# Patient Record
Sex: Female | Born: 1945 | Race: White | Hispanic: No | Marital: Married | Smoking: Never smoker
Health system: Southern US, Community
[De-identification: ages and names within clinical notes are randomized; demographics above are authoritative.]

## PROBLEM LIST (undated history)

## (undated) DIAGNOSIS — N179 Acute kidney failure, unspecified: Secondary | ICD-10-CM

## (undated) DIAGNOSIS — F329 Major depressive disorder, single episode, unspecified: Secondary | ICD-10-CM

## (undated) DIAGNOSIS — I10 Essential (primary) hypertension: Secondary | ICD-10-CM

## (undated) DIAGNOSIS — C801 Malignant (primary) neoplasm, unspecified: Secondary | ICD-10-CM

## (undated) DIAGNOSIS — E7800 Pure hypercholesterolemia, unspecified: Secondary | ICD-10-CM

## (undated) DIAGNOSIS — N12 Tubulo-interstitial nephritis, not specified as acute or chronic: Secondary | ICD-10-CM

## (undated) DIAGNOSIS — G8929 Other chronic pain: Secondary | ICD-10-CM

## (undated) DIAGNOSIS — M1990 Unspecified osteoarthritis, unspecified site: Secondary | ICD-10-CM

## (undated) DIAGNOSIS — F419 Anxiety disorder, unspecified: Secondary | ICD-10-CM

## (undated) DIAGNOSIS — F32A Depression, unspecified: Secondary | ICD-10-CM

## (undated) HISTORY — PX: TUBAL LIGATION: SHX77

## (undated) HISTORY — PX: BREAST SURGERY: SHX581

---

## 2009-09-14 ENCOUNTER — Emergency Department (HOSPITAL_BASED_OUTPATIENT_CLINIC_OR_DEPARTMENT_OTHER): Admission: EM | Admit: 2009-09-14 | Discharge: 2009-09-15 | Payer: Self-pay | Admitting: Emergency Medicine

## 2009-09-14 ENCOUNTER — Ambulatory Visit: Payer: Self-pay | Admitting: Radiology

## 2010-08-21 IMAGING — CT CT HEAD W/O CM
2 series · 16 of 30 positions shown, 18 images · non-contrast
Comparison: None available.

CLINICAL DATA: Confusion.  Nausea.  Fall.

CT HEAD WITHOUT CONTRAST
TECHNIQUE: Contiguous axial images were obtained from the base of
the skull through the vertex without contrast.

[Series 2: head 4.8 h37s · axial · 0.44mm/px · z∈[-112,+5]mm · 8 of 32 slices shown, 10 images]
[im 4/32  brain]
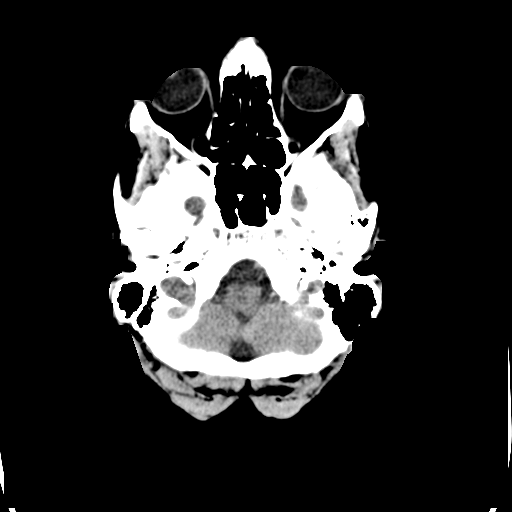
[im 4/32  bone]
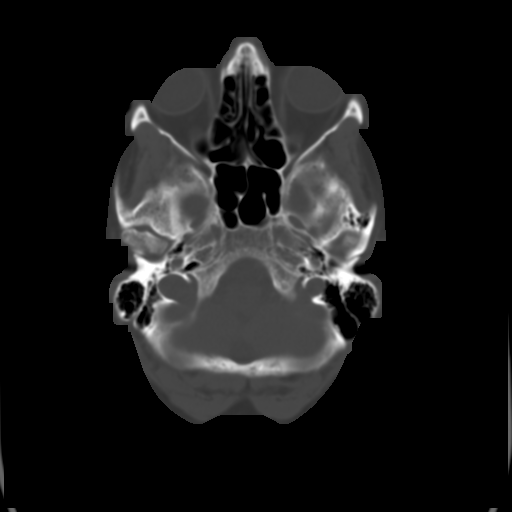
[im 7/32  brain]
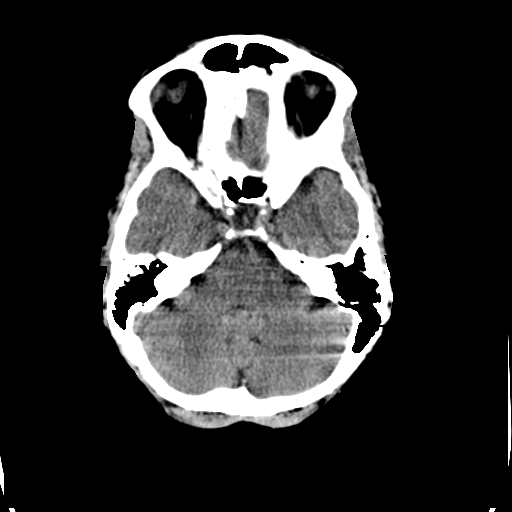
[im 11/32  brain]
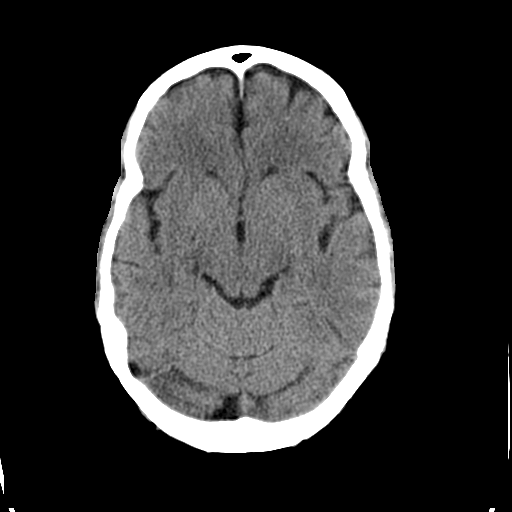
[im 14/32  brain]
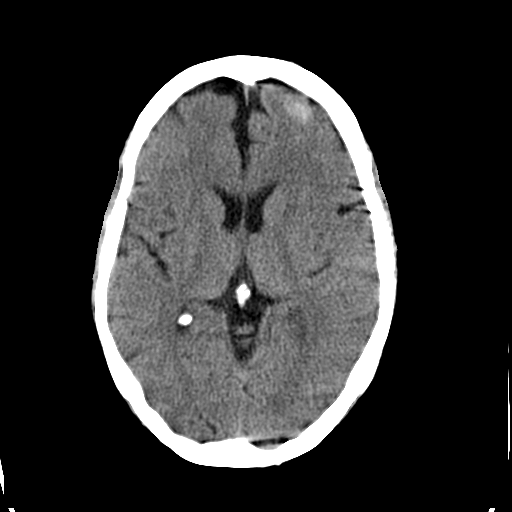
[im 18/32  brain]
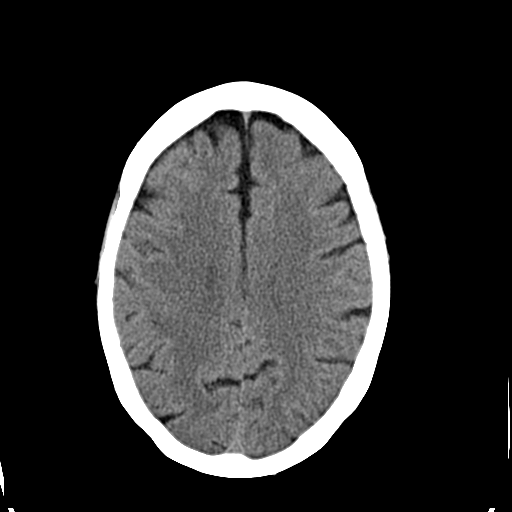
[im 18/32  bone]
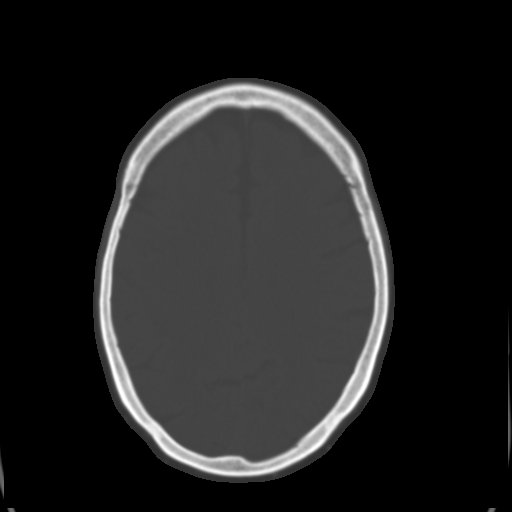
[im 21/32  brain]
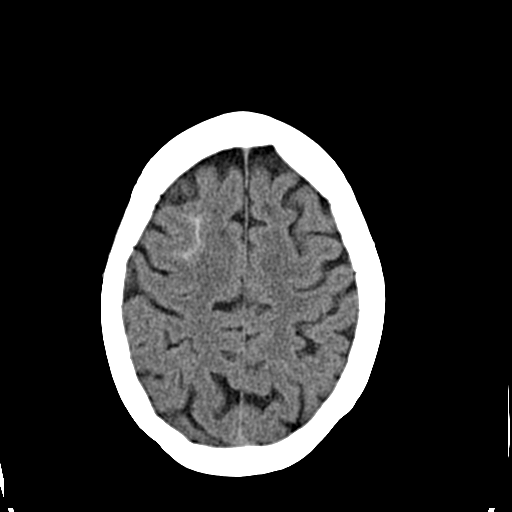
[im 25/32  brain]
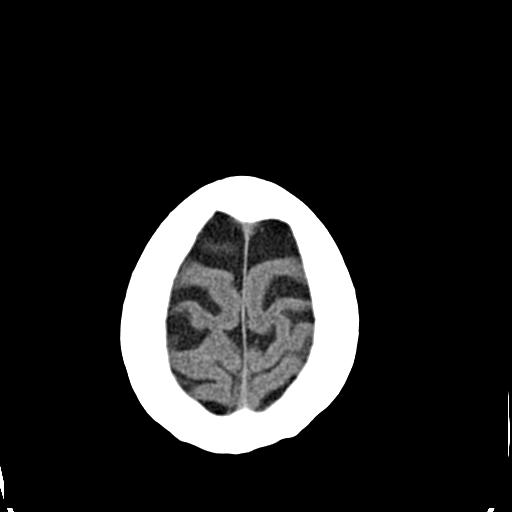
[im 28/32  brain]
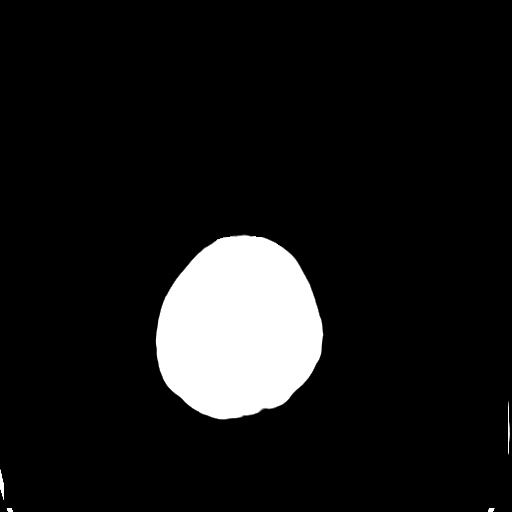

[Series 3: head 2.4 h60s bone · axial · 0.44mm/px · z∈[-113,+8]mm · 8 of 64 slices shown]
[im 7/64  bone]
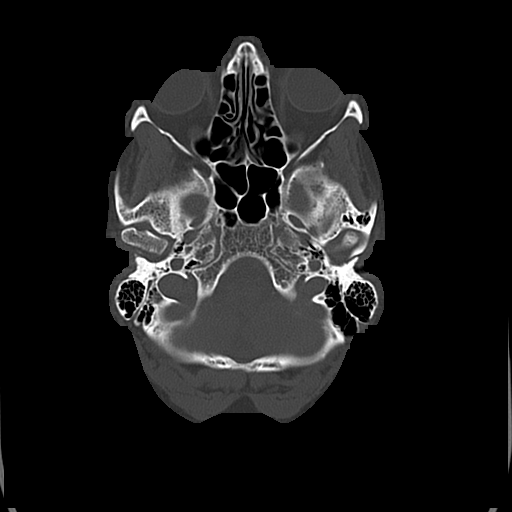
[im 14/64  bone]
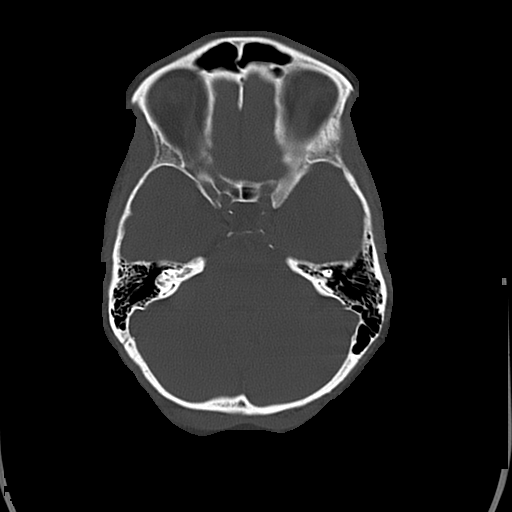
[im 20/64  bone]
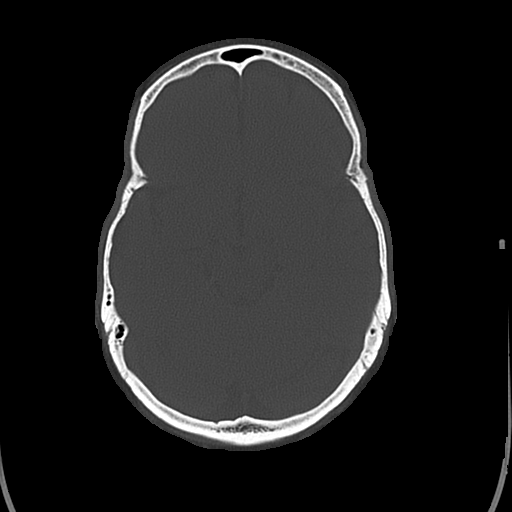
[im 27/64  bone]
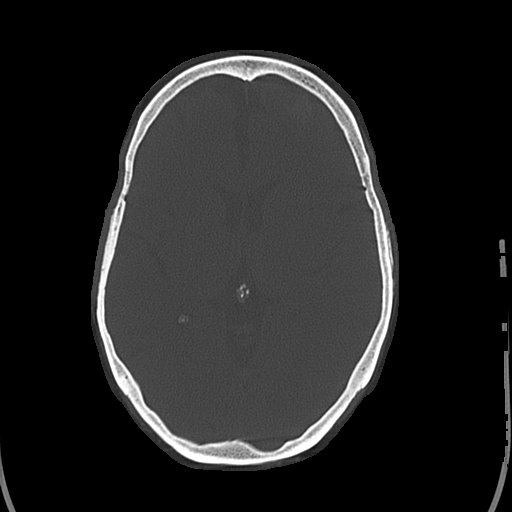
[im 37/64  bone]
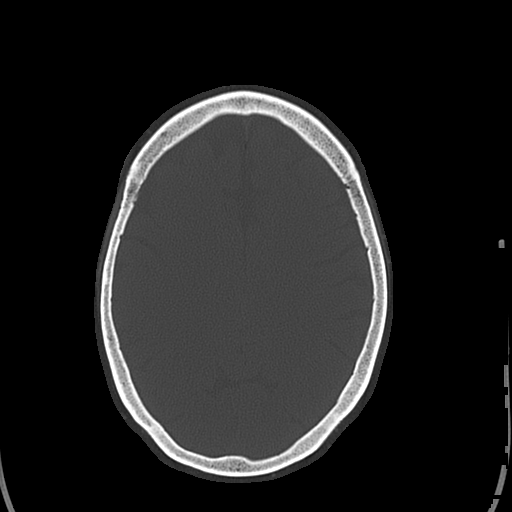
[im 44/64  bone]
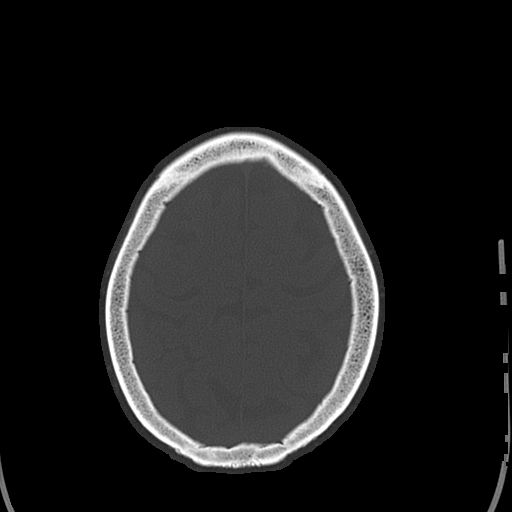
[im 50/64  bone]
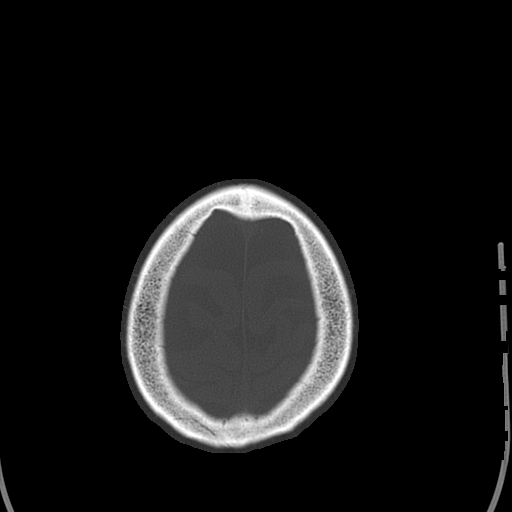
[im 57/64  bone]
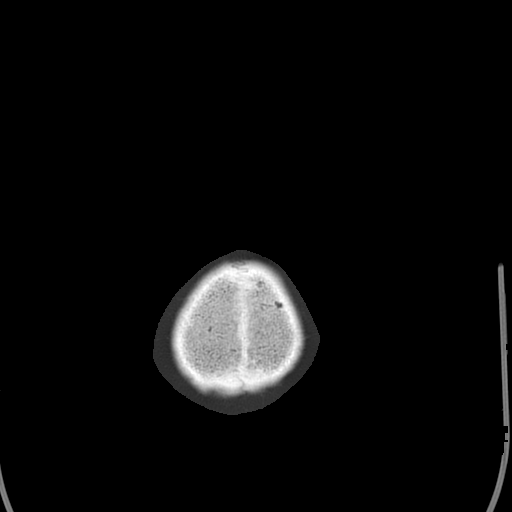

[16 of 30 positions shown; findings below may reference images not displayed]

FINDINGS: There is a hemorrhagic contusion involving the left
frontal lobe.  There is some subarachnoid blood as well.
Subarachnoid blood is noted within the right to frontal lobe as
well on image number 21.  The study is mildly degraded by patient
motion.  The paranasal sinuses and mastoid air cells are clear.
The osseous skull is intact.  The extracranial soft tissues are
unremarkable.
IMPRESSION: 1.  Hemorrhagic contusion left frontal lobe.
2.  Bilateral frontal subarachnoid blood is likely related to
trauma.

Critical test results telephoned to Dr. Kamara at the time of

## 2011-04-02 LAB — BASIC METABOLIC PANEL
CO2: 28 mEq/L (ref 19–32)
Calcium: 9.9 mg/dL (ref 8.4–10.5)
Creatinine, Ser: 1 mg/dL (ref 0.4–1.2)
GFR calc non Af Amer: 56 mL/min — ABNORMAL LOW (ref >60)
Glucose, Bld: 89 mg/dL (ref 70–99)

## 2011-04-02 LAB — PROTIME-INR
INR: 0.9 (ref 0.00–1.49)
Prothrombin Time: 12.1 seconds (ref 11.6–15.2)

## 2011-04-02 LAB — DIFFERENTIAL
Basophils Absolute: 0 10*3/uL (ref 0.0–0.1)
Basophils Relative: 0 % (ref 0–1)
Neutro Abs: 4.8 10*3/uL (ref 1.7–7.7)
Neutrophils Relative %: 67 % (ref 43–77)

## 2011-04-02 LAB — URINALYSIS, ROUTINE W REFLEX MICROSCOPIC
Bilirubin Urine: NEGATIVE
Ketones, ur: NEGATIVE mg/dL
Nitrite: NEGATIVE
Protein, ur: NEGATIVE mg/dL
pH: 6.5 (ref 5.0–8.0)

## 2011-04-02 LAB — URINE MICROSCOPIC-ADD ON

## 2011-04-02 LAB — CBC
MCHC: 35.1 g/dL (ref 30.0–36.0)
Platelets: 201 10*3/uL (ref 150–400)
RDW: 13.2 % (ref 11.5–15.5)

## 2011-04-02 LAB — APTT: aPTT: 27 seconds (ref 24–37)

## 2017-07-27 DIAGNOSIS — N179 Acute kidney failure, unspecified: Secondary | ICD-10-CM

## 2017-07-27 HISTORY — DX: Acute kidney failure, unspecified: N17.9

## 2017-07-30 ENCOUNTER — Emergency Department (HOSPITAL_BASED_OUTPATIENT_CLINIC_OR_DEPARTMENT_OTHER)
Admission: EM | Admit: 2017-07-30 | Discharge: 2017-07-31 | Disposition: A | Discharge status: Home / Self Care | Payer: Medicare HMO | Attending: Emergency Medicine | Admitting: Emergency Medicine

## 2017-07-30 ENCOUNTER — Encounter (HOSPITAL_BASED_OUTPATIENT_CLINIC_OR_DEPARTMENT_OTHER): Payer: Self-pay | Admitting: Emergency Medicine

## 2017-07-30 ENCOUNTER — Emergency Department (HOSPITAL_BASED_OUTPATIENT_CLINIC_OR_DEPARTMENT_OTHER): Payer: Medicare HMO

## 2017-07-30 DIAGNOSIS — R55 Syncope and collapse: Secondary | ICD-10-CM | POA: Diagnosis present

## 2017-07-30 DIAGNOSIS — Z79899 Other long term (current) drug therapy: Secondary | ICD-10-CM | POA: Diagnosis not present

## 2017-07-30 DIAGNOSIS — N3000 Acute cystitis without hematuria: Secondary | ICD-10-CM | POA: Diagnosis not present

## 2017-07-30 DIAGNOSIS — I10 Essential (primary) hypertension: Secondary | ICD-10-CM | POA: Insufficient documentation

## 2017-07-30 DIAGNOSIS — I951 Orthostatic hypotension: Secondary | ICD-10-CM

## 2017-07-30 HISTORY — DX: Depression, unspecified: F32.A

## 2017-07-30 HISTORY — DX: Pure hypercholesterolemia, unspecified: E78.00

## 2017-07-30 HISTORY — DX: Other chronic pain: G89.29

## 2017-07-30 HISTORY — DX: Anxiety disorder, unspecified: F41.9

## 2017-07-30 HISTORY — DX: Essential (primary) hypertension: I10

## 2017-07-30 HISTORY — DX: Unspecified osteoarthritis, unspecified site: M19.90

## 2017-07-30 HISTORY — DX: Major depressive disorder, single episode, unspecified: F32.9

## 2017-07-30 LAB — URINALYSIS, ROUTINE W REFLEX MICROSCOPIC
BILIRUBIN URINE: NEGATIVE
GLUCOSE, UA: NEGATIVE mg/dL
Ketones, ur: NEGATIVE mg/dL
Nitrite: POSITIVE — AB
PH: 6 (ref 5.0–8.0)
Protein, ur: NEGATIVE mg/dL
SPECIFIC GRAVITY, URINE: 1.019 (ref 1.005–1.030)

## 2017-07-30 LAB — CBC WITH DIFFERENTIAL/PLATELET
BASOS ABS: 0 10*3/uL (ref 0.0–0.1)
BASOS PCT: 0 %
Eosinophils Absolute: 0.3 10*3/uL (ref 0.0–0.7)
Eosinophils Relative: 3 %
HEMATOCRIT: 40.6 % (ref 36.0–46.0)
HEMOGLOBIN: 13.4 g/dL (ref 12.0–15.0)
LYMPHS PCT: 24 %
Lymphs Abs: 2.8 10*3/uL (ref 0.7–4.0)
MCH: 31.2 pg (ref 26.0–34.0)
MCHC: 33 g/dL (ref 30.0–36.0)
MCV: 94.6 fL (ref 78.0–100.0)
MONO ABS: 0.8 10*3/uL (ref 0.1–1.0)
Monocytes Relative: 7 %
NEUTROS ABS: 7.9 10*3/uL — AB (ref 1.7–7.7)
NEUTROS PCT: 66 %
Platelets: 244 10*3/uL (ref 150–400)
RBC: 4.29 MIL/uL (ref 3.87–5.11)
RDW: 14.6 % (ref 11.5–15.5)
WBC: 11.8 10*3/uL — AB (ref 4.0–10.5)

## 2017-07-30 LAB — BASIC METABOLIC PANEL
ANION GAP: 12 (ref 5–15)
BUN: 26 mg/dL — ABNORMAL HIGH (ref 6–20)
CALCIUM: 9.6 mg/dL (ref 8.9–10.3)
CO2: 25 mmol/L (ref 22–32)
Chloride: 98 mmol/L — ABNORMAL LOW (ref 101–111)
Creatinine, Ser: 1.24 mg/dL — ABNORMAL HIGH (ref 0.44–1.00)
GFR, EST AFRICAN AMERICAN: 50 mL/min — AB (ref >60)
GFR, EST NON AFRICAN AMERICAN: 43 mL/min — AB (ref >60)
GLUCOSE: 121 mg/dL — AB (ref 65–99)
POTASSIUM: 3.2 mmol/L — AB (ref 3.5–5.1)
Sodium: 135 mmol/L (ref 135–145)

## 2017-07-30 LAB — URINALYSIS, MICROSCOPIC (REFLEX)

## 2017-07-30 LAB — TROPONIN I: Troponin I: <0.03 ng/mL (ref <0.03)

## 2017-07-30 MED ORDER — CEPHALEXIN 250 MG PO CAPS
500.0000 mg | ORAL_CAPSULE | Freq: Once | ORAL | Status: AC
  Administered 2017-07-30: 500 mg via ORAL
  Filled 2017-07-30: qty 2

## 2017-07-30 MED ORDER — CEPHALEXIN 500 MG PO CAPS: 500.0000 mg | ORAL_CAPSULE | Freq: Two times a day (BID) | ORAL | 0 refills | Status: DC

## 2017-07-30 NOTE — Discharge Instructions (Signed)
Please take Keflex every 12 hours for the next 5 days. Please continue to drink plenty of fluids so you do not get hydrated. If he develop new or worsening symptoms including shortness of breath, chest pain, dizziness, or continue to have syncope, please return to the emergency department for reevaluation.  Please call and schedule a follow-up appointment with your primary care provider in the next 5-7 days regarding today's visit to the Emergency Department.

## 2017-07-30 NOTE — ED Triage Notes (Addendum)
Patient brought in by husband because " she is falling a lot and we need to figure out why" - He reports that she "passes" out when she stands up and get very tired all the time. Patient is argumentative that there is nothing wrong with her. Patient states "dont put me in a nursing home" Husband states that he is not going to but needs to figure out what is wrong, he thinks she might be dehydrated or low BP -  Husband reports that she needs to just let her talk. Patient denies any complaints.

## 2017-07-30 NOTE — ED Provider Notes (Signed)
[facility] DEPT Provider Note   CSN: [id number] Arrival date & time: 07/30/17  0938     History   Chief Complaint Chief Complaint  Patient presents with  . Near Syncope    HPI Sarah Huffman is a 71 y.o. female with a h/o of breast CA who presents to the emergency department with chief complaint of syncope. Her husband reports she has been falling more frequently over the last 3-4 days. She states that when she changes from a sitting to a standing position that she will get lightheaded, and her husband states that after she stands after she takes a few steps she will have a syncopal episode for approximately 3-4 seconds and fell to the ground. She states she has complained of left shoulder, knee, and hip pain from recent falls. He denies a history of similar. She denies HA, dizziness, tinnitus, chest pain, palpitations, dyspnea, or weakness. She reports no recent medication changes. She denies fever, chills, dysuria, hematochezia, or melena. She reports some urinary hesitancy over the last few weeks. No frequency, abdominal pain, or back pain. She reports that she tries to maintain good oral intake at home.   The history is provided by the patient. No language interpreter was used.    Past Medical History:  Diagnosis Date  . Anxiety   . Arthritis   . Chronic pain   . Depressed   . Hypercholesterolemia   . Hypertension     There are no active problems to display for this patient.   Past Surgical History:  Procedure Laterality Date  . BREAST SURGERY    . TUBAL LIGATION      OB History    No data available       Home Medications    Prior to Admission medications   Medication Sig Start Date End Date Taking? Authorizing Provider  alendronate (FOSAMAX) 70 MG tablet Take 70 mg by mouth once a week. Take with a full glass of water on an empty stomach.   Yes [provider]  diazepam (VALIUM) 5 MG tablet Take 5 mg by mouth every 6 (six) hours as needed for  anxiety.   Yes [provider]  DULoxetine (CYMBALTA) 20 MG capsule Take 20 mg by mouth daily.   Yes [provider]  gabapentin (NEURONTIN) 800 MG tablet Take 800 mg by mouth 3 (three) times daily.   Yes [provider]  lisinopril (PRINIVIL,ZESTRIL) 10 MG tablet Take 10 mg by mouth daily.   Yes [provider]  simvastatin (ZOCOR) 20 MG tablet Take 20 mg by mouth daily.   Yes [provider]  traMADol (ULTRAM) 50 MG tablet Take by mouth 2 (two) times daily.   Yes [provider]  cephALEXin (KEFLEX) 500 MG capsule Take 1 capsule (500 mg total) by mouth 2 (two) times daily. 07/30/17   Shamiracle Gorden, Laymond Purser, PA-C    Family History History reviewed. No pertinent family history.  Social History Social History  Substance Use Topics  . Smoking status: Never Smoker  . Smokeless tobacco: Never Used  . Alcohol use No     Allergies   Codeine and Ivp dye [iodinated diagnostic agents]   Review of Systems Review of Systems  Constitutional: Negative for activity change, chills and fever.  Respiratory: Negative for shortness of breath.   Cardiovascular: Negative for chest pain and palpitations.  Gastrointestinal: Negative for abdominal pain, anal bleeding, blood in stool, diarrhea, nausea and vomiting.  Genitourinary: Negative for dysuria and frequency.       +  Urinary hesitancy   Musculoskeletal: Negative for back pain.  Skin: Negative for rash.  Neurological: Positive for light-headedness. Negative for dizziness, weakness and headaches.   Physical Exam Updated Vital Signs BP 132/67   Pulse 71   Temp 98.8 F (37.1 C) (Oral)   Resp 20   Ht 5\' 4"  (1.626 m)   Wt 63.5 kg (140 lb)   SpO2 98%   BMI 24.03 kg/m   Physical Exam  Constitutional: She is oriented to person, place, and time. She appears well-developed and well-nourished. No distress.  HENT:  Head: Normocephalic.  Eyes: Pupils are equal, round, and reactive to light.  Conjunctivae and EOM are normal. No scleral icterus.  Neck: Normal range of motion. Neck supple.  Cardiovascular: Normal rate and regular rhythm.  Exam reveals no gallop and no friction rub.   No murmur heard. Pulmonary/Chest: Effort normal and breath sounds normal. No respiratory distress. She has no wheezes. She has no rales. She exhibits no tenderness.  Abdominal: Soft. Bowel sounds are normal. She exhibits no distension. There is no tenderness. There is no rebound and no guarding.  Musculoskeletal: She exhibits no deformity.  TTP over the left lateral knee and hip. Full active and passive ROM of both joints. No left ankle pain. No right lower extremity complaints.   Neurological: She is alert and oriented to person, place, and time.  Cranial nerves 2-12 intact. Finger-to-nose is normal. 5/5 motor strength of the bilateral upper and lower extremities. Moves all four extremities. Negative Romberg. Ambulatory without difficulty. NVI.    Skin: Skin is warm. Capillary refill takes less than 2 seconds. No rash noted. She is not diaphoretic.  Large area of ecchymosis noted to the left upper arm   Psychiatric: Her speech is normal and behavior is normal. Thought content normal. Her affect is inappropriate. She is not actively hallucinating. Cognition and memory are normal. She expresses impulsivity and inappropriate judgment. She is attentive.  Nursing note and vitals reviewed.    ED Treatments / Results  Labs (all labs ordered are listed, but only abnormal results are displayed) Labs Reviewed  CBC WITH DIFFERENTIAL/PLATELET - Abnormal; Notable for the following:       Result Value   WBC 11.8 (*)    Neutro Abs 7.9 (*)    All other components within normal limits  BASIC METABOLIC PANEL - Abnormal; Notable for the following:    Potassium 3.2 (*)    Chloride 98 (*)    Glucose, Bld 121 (*)    BUN 26 (*)    Creatinine, Ser 1.24 (*)    GFR calc non Af Amer 43 (*)    GFR calc Af Amer 50 (*)      All other components within normal limits  URINALYSIS, ROUTINE W REFLEX MICROSCOPIC - Abnormal; Notable for the following:    APPearance CLOUDY (*)    Hgb urine dipstick TRACE (*)    Nitrite POSITIVE (*)    Leukocytes, UA SMALL (*)    All other components within normal limits  URINALYSIS, MICROSCOPIC (REFLEX) - Abnormal; Notable for the following:    Bacteria, UA MANY (*)    Squamous Epithelial / LPF 0-5 (*)    All other components within normal limits  URINE CULTURE  TROPONIN I    EKG  EKG Interpretation  Date/Time:  Saturday July 30 2017 19:54:13 EDT Ventricular Rate:  77 PR Interval:    QRS Duration: 96 QT Interval:  347 QTC Calculation: 393 R Axis:   -  75 Text Interpretation:  Sinus rhythm Consider left atrial enlargement Inferior infarct, old Consider anterolateral infarct Q waves new when compared to prior Confirmed by Thayer Jew [phone]) on 07/30/2017 8:57:36 PM       Radiology Ct Head Wo Contrast  Result Date: 07/30/2017 CLINICAL DATA:  Multiple falls, syncopal episodes. Tired. History of hypertension and hypercholesterolemia. EXAM: CT HEAD WITHOUT CONTRAST TECHNIQUE: Contiguous axial images were obtained from the base of the skull through the vertex without intravenous contrast. COMPARISON:  CT HEAD September 14, 2009 FINDINGS: BRAIN: No intraparenchymal hemorrhage, mass effect nor midline shift. The ventricles and sulci are normal for age. Patchy supratentorial white matter hypodensities less than expected for patient's age, though non-specific are most compatible with chronic small vessel ischemic disease. No acute large vascular territory infarcts. Symmetrically prominent bifrontal parafalcine extra-axial spaces. Basal cisterns are patent. VASCULAR: Mild calcific atherosclerosis of the carotid siphons. SKULL: No skull fracture. No significant scalp soft tissue swelling. SINUSES/ORBITS: The mastoid air-cells and included paranasal sinuses are well-aerated.The  included ocular globes and orbital contents are non-suspicious. OTHER: None. IMPRESSION: 1. No acute intracranial process. 2. 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 3. Otherwise negative noncontrast CT HEAD for age. Electronically Signed   By: Elon Alas M.D.   On: 07/30/2017 23:28    Procedures Procedures (including critical care time)  Medications Ordered in ED Medications  cephALEXin (KEFLEX) capsule 500 mg (500 mg Oral Given 07/30/17 2349)     Initial Impression / Assessment and Plan / ED Course  I have reviewed the triage vital signs and the nursing notes.  Pertinent labs & imaging results that were available during my care of the patient were reviewed by me and considered in my medical decision making (see chart for details).     71 year old female presenting with increased falls and syncopal episodes 3-4 days. Orthostatic vitals 106/77 to 80/61 with standing. Cr 1.24 vs 1.0 in 2010. IVF bolus given and orthostatic vitals repeated; 129/93 sitting and 126/83 standing. Patient initially refused all imaging, but eventually complied with head CT. CT demonstrating small bifrontal hygroma vs old subdural hematomas; no acute pathology. Mild hypokalemia 3.2. EKG with new Q waves, but no evidence of prolonged Qtc. UA is nitrite positive with small leukocytes. Patient also endorses urinary hesitancy. Will treat for UTI with keflex. No anemia noted on CBC. Trop negative.   After fluid bolus, patient feels much improved. Discussed and evaluated the patient with Dr. Dina Rich, attending physician. Will d/c the patient to home with follow up her PCP within the next week. Strict return precautions given. NAD. The patient is safe for d/c at this time.   Final Clinical Impressions(s) / ED Diagnoses   Final diagnoses:  Orthostatic syncope  Acute cystitis without hematuria    New Prescriptions Discharge Medication List as of 07/30/2017 11:39 PM    START taking these medications    Details  cephALEXin (KEFLEX) 500 MG capsule Take 1 capsule (500 mg total) by mouth 2 (two) times daily., Starting Sat 07/30/2017, Print         Isayah Ignasiak A, PA-C 08/01/17 1962    Merryl Hacker, MD 08/01/17 [phone]

## 2017-08-04 LAB — URINE CULTURE: Culture: >=100000 — AB

## 2017-08-05 ENCOUNTER — Telehealth: Payer: Self-pay | Admitting: *Deleted

## 2017-08-05 NOTE — Telephone Encounter (Signed)
Post ED Visit - Positive Culture Follow-up  Culture report reviewed by antimicrobial stewardship pharmacist:  []  Elenor Quinones, Pharm.D. []  Heide Guile, Pharm.D., BCPS AQ-ID []  Parks Neptune, Pharm.D., BCPS []  Alycia Rossetti, Pharm.D., BCPS []  [place], Pharm.D., BCPS, AAHIVP []  Legrand Como, Pharm.D., BCPS, AAHIVP []  Salome Arnt, PharmD, BCPS []  Dimitri Ped, PharmD, BCPS []  Vincenza Hews, PharmD, BCPS Nida Boatman, PharmD  Positive urine culture Treated with Cephalexin, organism sensitive to the same and no further patient follow-up is required at this time.  [name] 08/05/2017, 11:10 AM

## 2017-08-17 ENCOUNTER — Observation Stay (HOSPITAL_BASED_OUTPATIENT_CLINIC_OR_DEPARTMENT_OTHER)
Admission: EM | Admit: 2017-08-17 | Discharge: 2017-08-19 | Disposition: A | Discharge status: Home / Self Care | Payer: Medicare HMO | Attending: Internal Medicine | Admitting: Internal Medicine

## 2017-08-17 ENCOUNTER — Emergency Department (HOSPITAL_BASED_OUTPATIENT_CLINIC_OR_DEPARTMENT_OTHER): Payer: Medicare HMO

## 2017-08-17 ENCOUNTER — Encounter (HOSPITAL_BASED_OUTPATIENT_CLINIC_OR_DEPARTMENT_OTHER): Payer: Self-pay

## 2017-08-17 DIAGNOSIS — G629 Polyneuropathy, unspecified: Secondary | ICD-10-CM | POA: Diagnosis not present

## 2017-08-17 DIAGNOSIS — N12 Tubulo-interstitial nephritis, not specified as acute or chronic: Secondary | ICD-10-CM | POA: Diagnosis not present

## 2017-08-17 DIAGNOSIS — F418 Other specified anxiety disorders: Secondary | ICD-10-CM | POA: Diagnosis not present

## 2017-08-17 DIAGNOSIS — R319 Hematuria, unspecified: Secondary | ICD-10-CM | POA: Diagnosis not present

## 2017-08-17 DIAGNOSIS — N183 Chronic kidney disease, stage 3 (moderate): Secondary | ICD-10-CM | POA: Diagnosis not present

## 2017-08-17 DIAGNOSIS — F419 Anxiety disorder, unspecified: Secondary | ICD-10-CM | POA: Insufficient documentation

## 2017-08-17 DIAGNOSIS — Z91041 Radiographic dye allergy status: Secondary | ICD-10-CM | POA: Insufficient documentation

## 2017-08-17 DIAGNOSIS — E876 Hypokalemia: Secondary | ICD-10-CM | POA: Diagnosis present

## 2017-08-17 DIAGNOSIS — R296 Repeated falls: Secondary | ICD-10-CM | POA: Insufficient documentation

## 2017-08-17 DIAGNOSIS — I10 Essential (primary) hypertension: Secondary | ICD-10-CM | POA: Diagnosis not present

## 2017-08-17 DIAGNOSIS — Z888 Allergy status to other drugs, medicaments and biological substances status: Secondary | ICD-10-CM | POA: Insufficient documentation

## 2017-08-17 DIAGNOSIS — N179 Acute kidney failure, unspecified: Secondary | ICD-10-CM | POA: Diagnosis not present

## 2017-08-17 DIAGNOSIS — M1990 Unspecified osteoarthritis, unspecified site: Secondary | ICD-10-CM | POA: Diagnosis not present

## 2017-08-17 DIAGNOSIS — D649 Anemia, unspecified: Secondary | ICD-10-CM | POA: Diagnosis not present

## 2017-08-17 DIAGNOSIS — Z885 Allergy status to narcotic agent status: Secondary | ICD-10-CM | POA: Diagnosis not present

## 2017-08-17 DIAGNOSIS — G8929 Other chronic pain: Secondary | ICD-10-CM | POA: Diagnosis present

## 2017-08-17 DIAGNOSIS — E871 Hypo-osmolality and hyponatremia: Secondary | ICD-10-CM | POA: Diagnosis not present

## 2017-08-17 DIAGNOSIS — R29898 Other symptoms and signs involving the musculoskeletal system: Secondary | ICD-10-CM | POA: Diagnosis present

## 2017-08-17 DIAGNOSIS — G9340 Encephalopathy, unspecified: Secondary | ICD-10-CM | POA: Diagnosis present

## 2017-08-17 DIAGNOSIS — Z79899 Other long term (current) drug therapy: Secondary | ICD-10-CM | POA: Insufficient documentation

## 2017-08-17 DIAGNOSIS — E7800 Pure hypercholesterolemia, unspecified: Secondary | ICD-10-CM | POA: Insufficient documentation

## 2017-08-17 DIAGNOSIS — I129 Hypertensive chronic kidney disease with stage 1 through stage 4 chronic kidney disease, or unspecified chronic kidney disease: Secondary | ICD-10-CM | POA: Insufficient documentation

## 2017-08-17 DIAGNOSIS — R6520 Severe sepsis without septic shock: Secondary | ICD-10-CM | POA: Insufficient documentation

## 2017-08-17 DIAGNOSIS — N390 Urinary tract infection, site not specified: Principal | ICD-10-CM

## 2017-08-17 DIAGNOSIS — F329 Major depressive disorder, single episode, unspecified: Secondary | ICD-10-CM | POA: Diagnosis not present

## 2017-08-17 DIAGNOSIS — R410 Disorientation, unspecified: Secondary | ICD-10-CM | POA: Diagnosis present

## 2017-08-17 DIAGNOSIS — I951 Orthostatic hypotension: Secondary | ICD-10-CM | POA: Diagnosis not present

## 2017-08-17 DIAGNOSIS — R509 Fever, unspecified: Secondary | ICD-10-CM

## 2017-08-17 DIAGNOSIS — R2681 Unsteadiness on feet: Secondary | ICD-10-CM | POA: Diagnosis not present

## 2017-08-17 HISTORY — DX: Tubulo-interstitial nephritis, not specified as acute or chronic: N12

## 2017-08-17 HISTORY — DX: Acute kidney failure, unspecified: N17.9

## 2017-08-17 HISTORY — DX: Malignant (primary) neoplasm, unspecified: C80.1

## 2017-08-17 LAB — CBC WITH DIFFERENTIAL/PLATELET
Basophils Absolute: 0 10*3/uL (ref 0.0–0.1)
Basophils Relative: 0 %
EOS PCT: 0 %
Eosinophils Absolute: 0 10*3/uL (ref 0.0–0.7)
HCT: 35.3 % — ABNORMAL LOW (ref 36.0–46.0)
Hemoglobin: 11.8 g/dL — ABNORMAL LOW (ref 12.0–15.0)
LYMPHS ABS: 0.8 10*3/uL (ref 0.7–4.0)
LYMPHS PCT: 6 %
MCH: 30.9 pg (ref 26.0–34.0)
MCHC: 33.4 g/dL (ref 30.0–36.0)
MCV: 92.4 fL (ref 78.0–100.0)
MONO ABS: 0.8 10*3/uL (ref 0.1–1.0)
MONOS PCT: 6 %
Neutro Abs: 12.2 10*3/uL — ABNORMAL HIGH (ref 1.7–7.7)
Neutrophils Relative %: 88 %
PLATELETS: 189 10*3/uL (ref 150–400)
RBC: 3.82 MIL/uL — ABNORMAL LOW (ref 3.87–5.11)
RDW: 14 % (ref 11.5–15.5)
WBC: 13.8 10*3/uL — ABNORMAL HIGH (ref 4.0–10.5)

## 2017-08-17 LAB — COMPREHENSIVE METABOLIC PANEL
ALBUMIN: 3.7 g/dL (ref 3.5–5.0)
ALT: 19 U/L (ref 14–54)
AST: 22 U/L (ref 15–41)
Alkaline Phosphatase: 87 U/L (ref 38–126)
Anion gap: 14 (ref 5–15)
BILIRUBIN TOTAL: 0.5 mg/dL (ref 0.3–1.2)
BUN: 26 mg/dL — AB (ref 6–20)
CHLORIDE: 86 mmol/L — AB (ref 101–111)
CO2: 26 mmol/L (ref 22–32)
Calcium: 9.2 mg/dL (ref 8.9–10.3)
Creatinine, Ser: 1.71 mg/dL — ABNORMAL HIGH (ref 0.44–1.00)
GFR calc Af Amer: 34 mL/min — ABNORMAL LOW (ref >60)
GFR calc non Af Amer: 29 mL/min — ABNORMAL LOW (ref >60)
GLUCOSE: 102 mg/dL — AB (ref 65–99)
POTASSIUM: 3.3 mmol/L — AB (ref 3.5–5.1)
Sodium: 126 mmol/L — ABNORMAL LOW (ref 135–145)
Total Protein: 7.9 g/dL (ref 6.5–8.1)

## 2017-08-17 LAB — URINALYSIS, MICROSCOPIC (REFLEX)

## 2017-08-17 LAB — RAPID URINE DRUG SCREEN, HOSP PERFORMED
Amphetamines: NOT DETECTED
BARBITURATES: NOT DETECTED
BENZODIAZEPINES: POSITIVE — AB
Cocaine: NOT DETECTED
Opiates: NOT DETECTED
Tetrahydrocannabinol: NOT DETECTED

## 2017-08-17 LAB — I-STAT VENOUS BLOOD GAS, ED
ACID-BASE EXCESS: 4 mmol/L — AB (ref 0.0–2.0)
Bicarbonate: 29.5 mmol/L — ABNORMAL HIGH (ref 20.0–28.0)
O2 SAT: 21 %
PH VEN: 7.429 (ref 7.250–7.430)
PO2 VEN: 15 mmHg — AB (ref 32.0–45.0)
TCO2: 31 mmol/L (ref 0–100)
pCO2, Ven: 44.5 mmHg (ref 44.0–60.0)

## 2017-08-17 LAB — URINALYSIS, ROUTINE W REFLEX MICROSCOPIC
Bilirubin Urine: NEGATIVE
GLUCOSE, UA: NEGATIVE mg/dL
Ketones, ur: NEGATIVE mg/dL
Nitrite: POSITIVE — AB
PROTEIN: 100 mg/dL — AB
Specific Gravity, Urine: 1.017 (ref 1.005–1.030)
pH: 6 (ref 5.0–8.0)

## 2017-08-17 LAB — TROPONIN I: Troponin I: <0.03 ng/mL (ref <0.03)

## 2017-08-17 LAB — CBG MONITORING, ED: GLUCOSE-CAPILLARY: 98 mg/dL (ref 65–99)

## 2017-08-17 LAB — I-STAT CG4 LACTIC ACID, ED: LACTIC ACID, VENOUS: 1.08 mmol/L (ref 0.5–1.9)

## 2017-08-17 LAB — AMMONIA: Ammonia: <9 umol/L — ABNORMAL LOW (ref 9–35)

## 2017-08-17 LAB — ETHANOL

## 2017-08-17 MED ORDER — POTASSIUM CHLORIDE IN NACL 20-0.9 MEQ/L-% IV SOLN
INTRAVENOUS | Status: AC
  Administered 2017-08-17 – 2017-08-18 (×2): via INTRAVENOUS
  Filled 2017-08-17 (×2): qty 1000

## 2017-08-17 MED ORDER — TRAMADOL HCL 50 MG PO TABS: 50.0000 mg | ORAL_TABLET | Freq: Two times a day (BID) | ORAL | Status: DC

## 2017-08-17 MED ORDER — POTASSIUM CHLORIDE CRYS ER 20 MEQ PO TBCR
20.0000 meq | EXTENDED_RELEASE_TABLET | Freq: Once | ORAL | Status: AC
  Administered 2017-08-17: 20 meq via ORAL
  Filled 2017-08-17: qty 1

## 2017-08-17 MED ORDER — ONDANSETRON HCL 4 MG PO TABS: 4.0000 mg | ORAL_TABLET | Freq: Four times a day (QID) | ORAL | Status: DC | PRN

## 2017-08-17 MED ORDER — BISACODYL 5 MG PO TBEC: 5.0000 mg | DELAYED_RELEASE_TABLET | Freq: Every day | ORAL | Status: DC | PRN

## 2017-08-17 MED ORDER — ACETAMINOPHEN 500 MG PO TABS
1000.0000 mg | ORAL_TABLET | Freq: Once | ORAL | Status: AC
  Administered 2017-08-17: 1000 mg via ORAL
  Filled 2017-08-17: qty 2

## 2017-08-17 MED ORDER — DIAZEPAM 5 MG PO TABS: 5.0000 mg | ORAL_TABLET | Freq: Two times a day (BID) | ORAL | Status: DC | PRN

## 2017-08-17 MED ORDER — DULOXETINE HCL 30 MG PO CPEP: 30.0000 mg | ORAL_CAPSULE | Freq: Every day | ORAL | Status: DC

## 2017-08-17 MED ORDER — SIMVASTATIN 20 MG PO TABS
20.0000 mg | ORAL_TABLET | Freq: Every day | ORAL | Status: DC
  Administered 2017-08-18: 20 mg via ORAL
  Filled 2017-08-17: qty 1

## 2017-08-17 MED ORDER — SODIUM CHLORIDE 0.9 % IV BOLUS (SEPSIS)
1000.0000 mL | Freq: Once | INTRAVENOUS | Status: AC
  Administered 2017-08-17: 1000 mL via INTRAVENOUS

## 2017-08-17 MED ORDER — DEXTROSE 5 % IV SOLN
1.0000 g | Freq: Once | INTRAVENOUS | Status: AC
  Administered 2017-08-17: 1 g via INTRAVENOUS
  Filled 2017-08-17: qty 10

## 2017-08-17 MED ORDER — HYDROCODONE-ACETAMINOPHEN 5-325 MG PO TABS
1.0000 | ORAL_TABLET | ORAL | Status: DC | PRN
  Administered 2017-08-17 – 2017-08-18 (×3): 1 via ORAL
  Administered 2017-08-18 – 2017-08-19 (×2): 2 via ORAL
  Filled 2017-08-17: qty 2
  Filled 2017-08-17: qty 1
  Filled 2017-08-17: qty 2
  Filled 2017-08-17 (×2): qty 1

## 2017-08-17 MED ORDER — ONDANSETRON HCL 4 MG/2ML IJ SOLN: 4.0000 mg | Freq: Four times a day (QID) | INTRAMUSCULAR | Status: DC | PRN

## 2017-08-17 MED ORDER — ACETAMINOPHEN 650 MG RE SUPP: 650.0000 mg | Freq: Four times a day (QID) | RECTAL | Status: DC | PRN

## 2017-08-17 MED ORDER — HEPARIN SODIUM (PORCINE) 5000 UNIT/ML IJ SOLN
5000.0000 [IU] | Freq: Three times a day (TID) | INTRAMUSCULAR | Status: DC
  Administered 2017-08-17 – 2017-08-18 (×3): 5000 [IU] via SUBCUTANEOUS
  Filled 2017-08-17 (×4): qty 1

## 2017-08-17 MED ORDER — SENNOSIDES-DOCUSATE SODIUM 8.6-50 MG PO TABS: 1.0000 | ORAL_TABLET | Freq: Every evening | ORAL | Status: DC | PRN

## 2017-08-17 MED ORDER — CEFTRIAXONE SODIUM 1 G IJ SOLR
1.0000 g | INTRAMUSCULAR | Status: DC
  Administered 2017-08-18 – 2017-08-19 (×2): 1 g via INTRAVENOUS
  Filled 2017-08-17 (×2): qty 10

## 2017-08-17 MED ORDER — BUPRENORPHINE 10 MCG/HR TD PTWK: 10.0000 ug | MEDICATED_PATCH | TRANSDERMAL | Status: DC

## 2017-08-17 MED ORDER — GABAPENTIN 400 MG PO CAPS
800.0000 mg | ORAL_CAPSULE | Freq: Two times a day (BID) | ORAL | Status: DC
  Administered 2017-08-17 – 2017-08-19 (×4): 800 mg via ORAL
  Filled 2017-08-17 (×4): qty 2

## 2017-08-17 MED ORDER — ACETAMINOPHEN 325 MG PO TABS: 650.0000 mg | ORAL_TABLET | Freq: Four times a day (QID) | ORAL | Status: DC | PRN

## 2017-08-17 MED ORDER — SODIUM CHLORIDE 0.9% FLUSH
3.0000 mL | Freq: Two times a day (BID) | INTRAVENOUS | Status: DC
  Administered 2017-08-17 – 2017-08-18 (×2): 3 mL via INTRAVENOUS

## 2017-08-17 NOTE — ED Triage Notes (Signed)
Pt c/o HA, unsteady gait, increase in confusion, weakenss 4 days-pt fell last night-was seen for similar c/o 2 weeks-was feeling better until the last 4 days-pt presents to triage in w/c with husband-NAD

## 2017-08-17 NOTE — Progress Notes (Signed)
Pr arrived to unit alert and oriented x4, pt oriented to floor and room call bell and phone in reach, md notified and awaiting orders

## 2017-08-17 NOTE — H&P (Signed)
History and Physical    Sarah Huffman OEV:[id number] DOB: [birth year] DOA: 08/17/2017  PCP: Patient, No Pcp Per   Patient coming from: Home, by way of [facility] ED   Chief Complaint: Confusion, gen weakness   HPI: Sarah Huffman is a 71 y.o. female with medical history significant for hypertension, chronic pain, and depression with anxiety, now presenting to the emergency department for evaluation of progressive confusion and generalized weakness. Patient had a similar presentation to the emergency department couple weeks ago, her condition was attributed to dehydration with orthostasis, and she improved with IV fluid hydration. Now, over the past 4 days, symptoms have returned, and have progressively worsened. Patient has not been expressing any specific complaints. Husband is noted her to be confused and generally weak, with difficulty ambulating. She has not been vomiting and does not seem to be coughing or in any respiratory distress. She is not known to use alcohol or illicit substances.  [facility] ED Course: Upon arrival to the ED, patient is found to be febrile to 39.3 C, saturating well on room air, and with vitals otherwise stable. EKG features a sinus rhythm with anterolateral Q waves. Chest x-ray is negative for acute cardiopulmonary disease and non-contrast head CT is negative for acute intracranial abnormality. Chemistry panel was notable for a sodium of 126, having been normal less than 3 weeks ago, potassium of 3.3, and creatinine 1.71, up from 1.24 earlier this month. CBC is notable for a leukocytosis to 13,800 and a mild normocytic anemia with hemoglobin of 11.8. Ammonia level is normal and ethanol is undetectable. UDS is positive for her prescribed benzodiazepine only. Urinalysis is sugestive of infection. Lactic acid is reassuring at 1.08. She was given a liter of normal saline, Tylenol, and empiric Rocephin after blood cultures have been collected. Emergency department  physician appreciated some focal right arm weakness on exam and neurology was consulted. Patient remained hemodynamically stable and in no apparent respiratory distress and will be admitted to the telemetry unit at [facility] for ongoing evaluation and management of acute encephalopathy suspected secondary to UTI, with some concern for possible acute neurologic event.  Review of Systems:  All other systems reviewed and apart from HPI, are negative.  Past Medical History:  Diagnosis Date  . Anxiety   . Arthritis   . Chronic pain   . Depressed   . Hypercholesterolemia   . Hypertension     Past Surgical History:  Procedure Laterality Date  . BREAST SURGERY    . TUBAL LIGATION       reports that she has never smoked. She has never used smokeless tobacco. She reports that she does not drink alcohol or use drugs.  Allergies  Allergen Reactions  . Amitriptyline Other (See Comments)    Syncope  . Codeine Other (See Comments)    Family History of an allergy  . Ivp Dye [Iodinated Diagnostic Agents] Other (See Comments) and Hypertension       . Meloxicam Swelling    History reviewed. No pertinent family history.   Prior to Admission medications   Medication Sig Start Date End Date Taking? Authorizing Provider  alendronate (FOSAMAX) 70 MG tablet Take 70 mg by mouth once a week. Take with a full glass of water on an empty stomach.   Yes [provider]  buprenorphine (BUTRANS - DOSED MCG/HR) 10 MCG/HR PTWK patch Place 1 patch onto the skin every 7 (seven) days. 07/24/17  Yes [provider]  diazepam (VALIUM)  5 MG tablet Take 5 mg by mouth 2 (two) times daily as needed for anxiety.    Yes [provider]  DULoxetine (CYMBALTA) 30 MG capsule Take 30 mg by mouth daily. 06/08/17  Yes [provider]  gabapentin (NEURONTIN) 400 MG capsule Take 800 mg by mouth 4 (four) times daily. 07/28/17  Yes [provider]  lisinopril  (PRINIVIL,ZESTRIL) 10 MG tablet Take 10 mg by mouth daily.   Yes [provider]  simvastatin (ZOCOR) 20 MG tablet Take 20 mg by mouth daily.   Yes [provider]  triamterene-hydrochlorothiazide (DYAZIDE) 37.5-25 MG capsule Take 1 capsule by mouth daily. 07/15/17  Yes [provider]  traMADol (ULTRAM) 50 MG tablet Take by mouth 2 (two) times daily.    [provider]    Physical Exam: Vitals:   08/17/17 1600 08/17/17 1603 08/17/17 1617 08/17/17 1904  BP: 130/81  113/79 106/82  Pulse:  88 85 76  Resp: (!) 22 (!) 23 20   Temp:   99.1 F (37.3 C) 98.2 F (36.8 C)  TempSrc:   Oral Oral  SpO2:  95% 98% 96%  Weight:      Height:          Constitutional: NAD, calm, uncomfortable, irritable  Eyes: PERTLA, lids and conjunctivae normal ENMT: Mucous membranes are moist. Posterior pharynx clear of any exudate or lesions.   Neck: normal, supple, no masses, no thyromegaly Respiratory: clear to auscultation bilaterally, no wheezing, no crackles. Normal respiratory effort.   Cardiovascular: S1 & S2 heard, regular rate and rhythm. No significant JVD. Abdomen: No distension, soft, tender in lower abd without rebound pain or guarding. Bowel sounds normal.  Musculoskeletal: no clubbing / cyanosis. No joint deformity upper and lower extremities.  Skin: no significant rashes, lesions, ulcers. Warm, dry, well-perfused. Neurologic: CN 2-12 grossly intact. Sensation intact, DTR normal. Strength 5/5 in all 4 limbs.  Psychiatric: Alert and oriented x 3. Upset about not being fed yet.      Labs on Admission: I have personally reviewed following labs and imaging studies  CBC:  Recent Labs Lab 08/17/17 1309  WBC 13.8*  NEUTROABS 12.2*  HGB 11.8*  HCT 35.3*  MCV 92.4  PLT 607   Basic Metabolic Panel:  Recent Labs Lab 08/17/17 1309  NA 126*  K 3.3*  CL 86*  CO2 26  GLUCOSE 102*  BUN 26*  CREATININE 1.71*  CALCIUM 9.2   GFR: Estimated Creatinine  Clearance: 26.4 mL/min (A) (by C-G formula based on SCr of 1.71 mg/dL (H)). Liver Function Tests:  Recent Labs Lab 08/17/17 1309  AST 22  ALT 19  ALKPHOS 87  BILITOT 0.5  PROT 7.9  ALBUMIN 3.7   No results for input(s): LIPASE, AMYLASE in the last 168 hours.  Recent Labs Lab 08/17/17 1348  AMMONIA <9*   Coagulation Profile: No results for input(s): INR, PROTIME in the last 168 hours. Cardiac Enzymes:  Recent Labs Lab 08/17/17 1348  TROPONINI <0.03   BNP (last 3 results) No results for input(s): PROBNP in the last 8760 hours. HbA1C: No results for input(s): HGBA1C in the last 72 hours. CBG:  Recent Labs Lab 08/17/17 1347  GLUCAP 98   Lipid Profile: No results for input(s): CHOL, HDL, LDLCALC, TRIG, CHOLHDL, LDLDIRECT in the last 72 hours. Thyroid Function Tests: No results for input(s): TSH, T4TOTAL, FREET4, T3FREE, THYROIDAB in the last 72 hours. Anemia Panel: No results for input(s): VITAMINB12, FOLATE, FERRITIN, TIBC, IRON, RETICCTPCT in the  last 72 hours. Urine analysis:    Component Value Date/Time   COLORURINE YELLOW 08/17/2017 1437   APPEARANCEUR CLOUDY (A) 08/17/2017 1437   LABSPEC 1.017 08/17/2017 1437   PHURINE 6.0 08/17/2017 1437   GLUCOSEU NEGATIVE 08/17/2017 1437   HGBUR MODERATE (A) 08/17/2017 1437   BILIRUBINUR NEGATIVE 08/17/2017 1437   KETONESUR NEGATIVE 08/17/2017 1437   PROTEINUR 100 (A) 08/17/2017 1437   UROBILINOGEN 0.2 09/14/2009 2220   NITRITE POSITIVE (A) 08/17/2017 1437   LEUKOCYTESUR MODERATE (A) 08/17/2017 1437   Sepsis Labs: @LABRCNTIP (procalcitonin:4,lacticidven:4) )No results found for this or any previous visit (from the past 240 hour(s)).   Radiological Exams on Admission: Dg Chest 2 View  Result Date: 08/17/2017 CLINICAL DATA:  Altered mental status. The patient fell last night. Unsteady gait. Headache. EXAM: CHEST  2 VIEW COMPARISON:  None. FINDINGS: Heart size and pulmonary vascularity are normal and the lungs are  clear except for slight accentuation of the interstitial markings at the bases in a tiny calcified granuloma in the right midzone. No effusions. No acute bone abnormality. IMPRESSION: No significant abnormality. Probable chronic accentuation of the interstitial markings at the bases. Electronically Signed   By: Lorriane Shire M.D.   On: 08/17/2017 13:39   Ct Head Wo Contrast  Result Date: 08/17/2017 CLINICAL DATA:  Headache and altered level of consciousness. EXAM: CT HEAD WITHOUT CONTRAST TECHNIQUE: Contiguous axial images were obtained from the base of the skull through the vertex without intravenous contrast. COMPARISON:  07/30/2017 FINDINGS: Brain: No evidence of acute infarction, hemorrhage, hydrocephalus, or mass. Chronic widening of the interhemispheric fissure, possible mild hygroma. There is atrophy with sulcal widening greatest in the frontal lobes. Partially empty sella, incidental to the history. Vascular: No hyperdense vessel or unexpected calcification. Skull: No acute or aggressive finding Sinuses/Orbits: Negative IMPRESSION: No acute finding or change from prior. Electronically Signed   By: Monte Fantasia M.D.   On: 08/17/2017 13:29    EKG: Independently reviewed. Sinus rhythm, anterolateral Q-wave.   Assessment/Plan  1. Febrile UTI  - Pt presents with confusion, found to be febrile with leukocytosis and UA suggestive of infection  - Blood cultures were collected in ED and pt was treated with empiric Rocephin   - Prior culture grew pan-sensitive E coli  - Plan to send urine for culture and continue empiric Rocephin    2. Acute kidney injury  - SCr is 1.71, up from 1.24 earlier this month  - Likely a prerenal azotemia in setting of acute infection process  - Check renal US for obstructive etiology   - Continue IVF hydration, hold lisinopril and diuretics, repeat chemistries in am    3. Acute encephalopathy, transient right arm weakness  - Pt presents with several days of  worsening confusion, noted to have subtle RUE weakness in ED that seemed to resolve prior to transfer here  - Head CT negative for acute intracranial abnormality  - Check MRI, with additional workup if positive   4. Hyponatremia  - Serum sodium is 126 on admission in setting of hypovolemia  - She was given a liter of NS in ED and is continued on NS infusion  - Hold HCTZ, continue IVF hydration, repeat chem panel in am   5. Hypokalemia  - Serum potassium is 3.3 on admission  - Treated with 20 mEq oral potassium and KCl added to IVF  - Continue cardiac monitoring and repeat chem panel in am    6. Hypertension  - BP at goal  -  Hold triamterene-HCTZ and lisinopril until renal function and electrolytes stabilized  - Treat with prn hydralazine IVP's for now    7. Depression, anxiety  - Stable  - Continue Cymbalta and Valium     DVT prophylaxis: sq heparin  Code Status: Full  Family Communication: Discussed with patient Disposition Plan: Observe on telemetry Consults called: Neurology Admission status: Observation    Vianne Bulls, MD [facility] Pager [phone]  If 7PM-7AM, please contact night-coverage [email or web address] Password [facility]  08/17/2017, 7:29 PM

## 2017-08-17 NOTE — ED Provider Notes (Signed)
[facility] Provider Note   CSN: [id number] Arrival date & time: 08/17/17  1230     History   Chief Complaint Chief Complaint  Patient presents with  . Headache    HPI Alexx Giambra is a 71 y.o. female.  71 yo F with a chief complaint of altered mental status. Going on for the past 3 or 4 days. Patient was seen in the ED about 2 weeks ago for a similar presentation. At that time it was thought that she was orthostatic. Was given fluids and had significant improvement until about 3 days ago. The patient has had difficulty walking at home and has been stumbling back and forth in the hallway. Also complaining of a diffuse headache. Complains of some subjective fevers. Denies cough congestion vomiting or diarrhea. Denies dysuria or increased frequency or hesitancy. Denies abdominal pain. The patient fell last night and struck her head against the door. Denies head injury prior to that. Was taken off of narcotic pain patch about 2 weeks ago. Denies other medication changes.   The history is provided by the patient and the spouse.  Illness  This is a recurrent problem. The current episode started more than 2 days ago. The problem occurs constantly. The problem has been gradually worsening. Pertinent negatives include no chest pain, no headaches and no shortness of breath. Nothing aggravates the symptoms. Nothing relieves the symptoms. She has tried nothing for the symptoms. The treatment provided no relief.    Past Medical History:  Diagnosis Date  . Anxiety   . Arthritis   . Chronic pain   . Depressed   . Hypercholesterolemia   . Hypertension     Patient Active Problem List   Diagnosis Date Noted  . Pyelonephritis 08/17/2017    Past Surgical History:  Procedure Laterality Date  . BREAST SURGERY    . TUBAL LIGATION      OB History    No data available       Home Medications    Prior to Admission medications   Medication Sig Start Date End Date Taking?  Authorizing Provider  alendronate (FOSAMAX) 70 MG tablet Take 70 mg by mouth once a week. Take with a full glass of water on an empty stomach.    [provider]  diazepam (VALIUM) 5 MG tablet Take 5 mg by mouth every 6 (six) hours as needed for anxiety.    [provider]  DULoxetine (CYMBALTA) 20 MG capsule Take 20 mg by mouth daily.    [provider]  gabapentin (NEURONTIN) 800 MG tablet Take 800 mg by mouth 3 (three) times daily.    [provider]  lisinopril (PRINIVIL,ZESTRIL) 10 MG tablet Take 10 mg by mouth daily.    [provider]  simvastatin (ZOCOR) 20 MG tablet Take 20 mg by mouth daily.    [provider]  traMADol (ULTRAM) 50 MG tablet Take by mouth 2 (two) times daily.    [provider]    Family History No family history on file.  Social History Social History  Substance Use Topics  . Smoking status: Never Smoker  . Smokeless tobacco: Never Used  . Alcohol use No     Allergies   Codeine and Ivp dye [iodinated diagnostic agents]   Review of Systems Review of Systems  Constitutional: Positive for activity change (confusion). Negative for chills and fever.  HENT: Negative for congestion and rhinorrhea.   Eyes: Negative for redness and visual disturbance.  Respiratory:  Negative for shortness of breath and wheezing.   Cardiovascular: Negative for chest pain and palpitations.  Gastrointestinal: Negative for nausea and vomiting.  Genitourinary: Negative for dysuria and urgency.  Musculoskeletal: Negative for arthralgias and myalgias.  Skin: Negative for pallor and wound.  Neurological: Positive for weakness. Negative for dizziness and headaches.     Physical Exam Updated Vital Signs BP 131/79 (BP Location: Right Arm)   Pulse 91   Temp (!) 102.7 F (39.3 C)   Resp 18   Ht 5\' 4"  (1.626 m)   Wt 63.5 kg (140 lb)   SpO2 96%   BMI 24.03 kg/m   Physical Exam  Constitutional: She is oriented to  person, place, and time. She appears well-developed and well-nourished. No distress.  HENT:  Head: Normocephalic and atraumatic.  Eyes: Pupils are equal, round, and reactive to light. EOM are normal.  Neck: Normal range of motion. Neck supple.  Cardiovascular: Normal rate and regular rhythm.  Exam reveals no gallop and no friction rub.   No murmur heard. Pulmonary/Chest: Effort normal. She has no wheezes. She has no rales.  Abdominal: Soft. She exhibits no distension. There is no tenderness.  Musculoskeletal: She exhibits no edema or tenderness.  Neurological: She is alert and oriented to person, place, and time. No cranial nerve deficit or sensory deficit. Gait abnormal. GCS eye subscore is 4. GCS verbal subscore is 4. GCS motor subscore is 6. She displays no Babinski's sign on the right side. She displays no Babinski's sign on the left side.  Reflex Scores:      Tricep reflexes are 2+ on the right side and 2+ on the left side.      Bicep reflexes are 2+ on the right side and 2+ on the left side.      Brachioradialis reflexes are 2+ on the right side and 2+ on the left side.      Patellar reflexes are 2+ on the right side and 2+ on the left side.      Achilles reflexes are 2+ on the right side and 2+ on the left side. Ataxic gait. Right upper extremity weakness 4 out of 5 compared to left upper cavity 5 out of 5. Worse with extension.  Skin: Skin is warm and dry. She is not diaphoretic.  Psychiatric: She has a normal mood and affect. Her behavior is normal.  Nursing note and vitals reviewed.    ED Treatments / Results  Labs (all labs ordered are listed, but only abnormal results are displayed) Labs Reviewed  COMPREHENSIVE METABOLIC PANEL - Abnormal; Notable for the following:       Result Value   Sodium 126 (*)    Potassium 3.3 (*)    Chloride 86 (*)    Glucose, Bld 102 (*)    BUN 26 (*)    Creatinine, Ser 1.71 (*)    GFR calc non Af Amer 29 (*)    GFR calc Af Amer 34 (*)     All other components within normal limits  CBC WITH DIFFERENTIAL/PLATELET - Abnormal; Notable for the following:    WBC 13.8 (*)    RBC 3.82 (*)    Hemoglobin 11.8 (*)    HCT 35.3 (*)    Neutro Abs 12.2 (*)    All other components within normal limits  AMMONIA - Abnormal; Notable for the following:    Ammonia <9 (*)    All other components within normal limits  RAPID URINE DRUG SCREEN, HOSP PERFORMED -  Abnormal; Notable for the following:    Benzodiazepines POSITIVE (*)    All other components within normal limits  URINALYSIS, ROUTINE W REFLEX MICROSCOPIC - Abnormal; Notable for the following:    APPearance CLOUDY (*)    Hgb urine dipstick MODERATE (*)    Protein, ur 100 (*)    Nitrite POSITIVE (*)    Leukocytes, UA MODERATE (*)    All other components within normal limits  URINALYSIS, MICROSCOPIC (REFLEX) - Abnormal; Notable for the following:    Bacteria, UA MANY (*)    Squamous Epithelial / LPF 0-5 (*)    All other components within normal limits  I-STAT VENOUS BLOOD GAS, ED - Abnormal; Notable for the following:    pO2, Ven 15.0 (*)    Bicarbonate 29.5 (*)    Acid-Base Excess 4.0 (*)    All other components within normal limits  CULTURE, BLOOD (ROUTINE X 2)  CULTURE, BLOOD (ROUTINE X 2)  ETHANOL  TROPONIN I  BLOOD GAS, VENOUS  CBG MONITORING, ED  I-STAT CG4 LACTIC ACID, ED    EKG  EKG Interpretation  Date/Time:  Wednesday August 17 2017 13:40:21 EDT Ventricular Rate:  96 PR Interval:    QRS Duration: 94 QT Interval:  337 QTC Calculation: 426 R Axis:   -96 Text Interpretation:  Sinus rhythm Anterolateral infarct, age indeterminate No significant change since last tracing Confirmed by Deno Etienne [phone]) on 08/17/2017 2:14:32 PM       Radiology Dg Chest 2 View  Result Date: 08/17/2017 CLINICAL DATA:  Altered mental status. The patient fell last night. Unsteady gait. Headache. EXAM: CHEST  2 VIEW COMPARISON:  None. FINDINGS: Heart size and pulmonary vascularity  are normal and the lungs are clear except for slight accentuation of the interstitial markings at the bases in a tiny calcified granuloma in the right midzone. No effusions. No acute bone abnormality. IMPRESSION: No significant abnormality. Probable chronic accentuation of the interstitial markings at the bases. Electronically Signed   By: Lorriane Shire M.D.   On: 08/17/2017 13:39   Ct Head Wo Contrast  Result Date: 08/17/2017 CLINICAL DATA:  Headache and altered level of consciousness. EXAM: CT HEAD WITHOUT CONTRAST TECHNIQUE: Contiguous axial images were obtained from the base of the skull through the vertex without intravenous contrast. COMPARISON:  07/30/2017 FINDINGS: Brain: No evidence of acute infarction, hemorrhage, hydrocephalus, or mass. Chronic widening of the interhemispheric fissure, possible mild hygroma. There is atrophy with sulcal widening greatest in the frontal lobes. Partially empty sella, incidental to the history. Vascular: No hyperdense vessel or unexpected calcification. Skull: No acute or aggressive finding Sinuses/Orbits: Negative IMPRESSION: No acute finding or change from prior. Electronically Signed   By: Monte Fantasia M.D.   On: 08/17/2017 13:29    Procedures Procedures (including critical care time)  Medications Ordered in ED Medications  sodium chloride 0.9 % bolus 1,000 mL (0 mLs Intravenous Stopped 08/17/17 1529)  cefTRIAXone (ROCEPHIN) 1 g in dextrose 5 % 50 mL IVPB (1 g Intravenous New Bag/Given 08/17/17 1524)  acetaminophen (TYLENOL) tablet 1,000 mg (1,000 mg Oral Given 08/17/17 1531)     Initial Impression / Assessment and Plan / ED Course  I have reviewed the triage vital signs and the nursing notes.  Pertinent labs & imaging results that were available during my care of the patient were reviewed by me and considered in my medical decision making (see chart for details).     71 yo F with a cc of AMS.  Patient  with some focal weakness on exam.  Will  discuss with neuro.   There are feels that the patient likely needs an MRI with a new focal weakness. He did feel that it was most likely an encephalopathy based on the history of presented.  Patient found to have a urinary tract infection. She also is febrile to 103. Will give Rocephin. Discuss with hospitalist.  Repeat neurologic exam significantly improved after IV fluids and Tylenol. No continued weakness.  The patients results and plan were reviewed and discussed.   Any x-rays performed were independently reviewed by myself.   Differential diagnosis were considered with the presenting HPI.  Medications  sodium chloride 0.9 % bolus 1,000 mL (0 mLs Intravenous Stopped 08/17/17 1529)  cefTRIAXone (ROCEPHIN) 1 g in dextrose 5 % 50 mL IVPB (1 g Intravenous New Bag/Given 08/17/17 1524)  acetaminophen (TYLENOL) tablet 1,000 mg (1,000 mg Oral Given 08/17/17 1531)    Vitals:   08/17/17 1239 08/17/17 1427 08/17/17 1430 08/17/17 1526  BP:    131/79  Pulse:    91  Resp:    18  Temp:  (!) 102.7 F (39.3 C) (!) 102.7 F (39.3 C)   TempSrc:  Rectal    SpO2:    96%  Weight: 63.5 kg (140 lb)     Height: 5\' 4"  (1.626 m)       Final diagnoses:  Disorientation  Fever in adult  Pyelonephritis    Admission/ observation were discussed with the admitting physician, patient and/or family and they are comfortable with the plan.    Final Clinical Impressions(s) / ED Diagnoses   Final diagnoses:  Disorientation  Fever in adult  Pyelonephritis    New Prescriptions New Prescriptions   No medications on file     Deno Etienne, DO 08/17/17 1600

## 2017-08-18 ENCOUNTER — Encounter (HOSPITAL_COMMUNITY): Payer: Self-pay | Admitting: General Practice

## 2017-08-18 ENCOUNTER — Observation Stay (HOSPITAL_COMMUNITY): Payer: Medicare HMO

## 2017-08-18 DIAGNOSIS — N179 Acute kidney failure, unspecified: Secondary | ICD-10-CM | POA: Diagnosis not present

## 2017-08-18 DIAGNOSIS — N390 Urinary tract infection, site not specified: Secondary | ICD-10-CM | POA: Diagnosis not present

## 2017-08-18 DIAGNOSIS — I10 Essential (primary) hypertension: Secondary | ICD-10-CM | POA: Diagnosis not present

## 2017-08-18 DIAGNOSIS — N12 Tubulo-interstitial nephritis, not specified as acute or chronic: Secondary | ICD-10-CM | POA: Diagnosis not present

## 2017-08-18 DIAGNOSIS — A419 Sepsis, unspecified organism: Secondary | ICD-10-CM

## 2017-08-18 DIAGNOSIS — G9340 Encephalopathy, unspecified: Secondary | ICD-10-CM | POA: Diagnosis not present

## 2017-08-18 DIAGNOSIS — R6520 Severe sepsis without septic shock: Secondary | ICD-10-CM | POA: Diagnosis not present

## 2017-08-18 DIAGNOSIS — E871 Hypo-osmolality and hyponatremia: Secondary | ICD-10-CM | POA: Diagnosis not present

## 2017-08-18 DIAGNOSIS — F418 Other specified anxiety disorders: Secondary | ICD-10-CM | POA: Diagnosis not present

## 2017-08-18 LAB — CBC WITH DIFFERENTIAL/PLATELET
Basophils Absolute: 0 10*3/uL (ref 0.0–0.1)
Basophils Relative: 0 %
EOS ABS: 0.1 10*3/uL (ref 0.0–0.7)
Eosinophils Relative: 1 %
HCT: 32.2 % — ABNORMAL LOW (ref 36.0–46.0)
HEMOGLOBIN: 10.8 g/dL — AB (ref 12.0–15.0)
LYMPHS ABS: 0.9 10*3/uL (ref 0.7–4.0)
Lymphocytes Relative: 12 %
MCH: 30.3 pg (ref 26.0–34.0)
MCHC: 33.5 g/dL (ref 30.0–36.0)
MCV: 90.4 fL (ref 78.0–100.0)
MONOS PCT: 8 %
Monocytes Absolute: 0.6 10*3/uL (ref 0.1–1.0)
NEUTROS PCT: 79 %
Neutro Abs: 6.2 10*3/uL (ref 1.7–7.7)
Platelets: 173 10*3/uL (ref 150–400)
RBC: 3.56 MIL/uL — ABNORMAL LOW (ref 3.87–5.11)
RDW: 14.1 % (ref 11.5–15.5)
WBC: 7.8 10*3/uL (ref 4.0–10.5)

## 2017-08-18 LAB — BASIC METABOLIC PANEL
Anion gap: 9 (ref 5–15)
BUN: 23 mg/dL — AB (ref 6–20)
CHLORIDE: 95 mmol/L — AB (ref 101–111)
CO2: 24 mmol/L (ref 22–32)
CREATININE: 1.46 mg/dL — AB (ref 0.44–1.00)
Calcium: 8.1 mg/dL — ABNORMAL LOW (ref 8.9–10.3)
GFR calc Af Amer: 41 mL/min — ABNORMAL LOW (ref >60)
GFR calc non Af Amer: 35 mL/min — ABNORMAL LOW (ref >60)
GLUCOSE: 87 mg/dL (ref 65–99)
Potassium: 3.2 mmol/L — ABNORMAL LOW (ref 3.5–5.1)
SODIUM: 128 mmol/L — AB (ref 135–145)

## 2017-08-18 MED ORDER — DULOXETINE HCL 30 MG PO CPEP
30.0000 mg | ORAL_CAPSULE | Freq: Every day | ORAL | Status: DC
  Administered 2017-08-18 – 2017-08-19 (×2): 30 mg via ORAL
  Filled 2017-08-18 (×2): qty 1

## 2017-08-18 MED ORDER — POTASSIUM CHLORIDE CRYS ER 20 MEQ PO TBCR
40.0000 meq | EXTENDED_RELEASE_TABLET | Freq: Once | ORAL | Status: AC
Start: 2017-08-18 — End: 2017-08-18
  Administered 2017-08-18: 40 meq via ORAL
  Filled 2017-08-18: qty 2

## 2017-08-18 MED ORDER — SODIUM CHLORIDE 0.9 % IV SOLN
INTRAVENOUS | Status: DC
  Administered 2017-08-18 – 2017-08-19 (×2): via INTRAVENOUS

## 2017-08-18 NOTE — Progress Notes (Signed)
Physical Therapy Brief Evaluation:  Pt evaluated for acute therapy needs. Prior to admission, pt completely independent. Pt had had multiple falls over the past 3 weeks potentially related to current hospitalization. Pt is refusing any therapy follow-up as her and her husband are confident they can perform exercise at discharge. PT will follow acutely.   Full evaluation to follow.   Scheryl Marten PT, DPT  [phone]

## 2017-08-18 NOTE — Evaluation (Signed)
Physical Therapy Evaluation Patient Details Name: Sarah Huffman MRN: [id number] DOB: 1946-11-10 Today's Date: 08/18/2017   History of Present Illness  Pt is a 71 yo female admitted through ED on 08/17/17 with confusion, generalize weakness and multiple falls. Pt was recently in ED s/p 2 weeks and diagnosed with dehydration, treated and returned home. At current admission, pt has been diagnosed with a UTI, acute encephalopathy, hyponatremia and hypokalemia. PMH significant for HTN, chronic pain, depression and anxiety.   Clinical Impression  Pt presents with the above diagnosis and below deficits for therapy evaluation. Prior to admission, pt had a steady functional decline over the past 3 weeks leading to increased falls. Pt was completely independent prior to disease exacerbation. Pt presents requiring Min Guard for mobility this session with gait and uses at least 1 hand on external surfaces for support throughout gait. Would recommend HHPT at discharge due to recent falls and high risk for falls, but husband and pt feel they can manage pt's recovery independently once they return home. PT will continue to follow acutely in order to address the below deficits prior to d/c.     Follow Up Recommendations No PT follow up;Other (comment) (pt and family declined)    Equipment Recommendations  None recommended by PT    Recommendations for Other Services       Precautions / Restrictions Precautions Precautions: Fall Restrictions Weight Bearing Restrictions: No      Mobility  Bed Mobility Overal bed mobility: Modified Independent             General bed mobility comments: able to get into and out of bed without any assistance  Transfers Overall transfer level: Modified independent Equipment used: None             General transfer comment: pt is able to stand from EOB without any hands on assistance. Pushed up from EOB to stand  Ambulation/Gait Ambulation/Gait assistance:  Supervision;Min guard Ambulation Distance (Feet): 250 Feet Assistive device: None Gait Pattern/deviations: Step-through pattern;Drifts right/left Gait velocity: decreased Gait velocity interpretation: Below normal speed for age/gender General Gait Details: Minimal unsteadiness with gait noted this session with pt using railing or counter as external support throughout gait. No LOB   Stairs            Wheelchair Mobility    Modified Rankin (Stroke Patients Only)       Balance Overall balance assessment: Needs assistance Sitting-balance support: No upper extremity supported;Feet supported Sitting balance-Leahy Scale: Normal     Standing balance support: No upper extremity supported Standing balance-Leahy Scale: Fair                               Pertinent Vitals/Pain Pain Assessment: No/denies pain    Home Living Family/patient expects to be discharged to:: Private residence Living Arrangements: Spouse/significant other Available Help at Discharge: Family;Available 24 hours/day Type of Home: House Home Access: Stairs to enter   [employer] of Steps: 3 Home Layout: Able to live on main level with bedroom/bathroom;Two level Home Equipment: None      Prior Function Level of Independence: Independent         Comments: usually independent at home.      Hand Dominance   Dominant Hand: Right    Extremity/Trunk Assessment   Upper Extremity Assessment Upper Extremity Assessment: Overall WFL for tasks assessed    Lower Extremity Assessment Lower Extremity Assessment: Overall WFL for tasks assessed  Cervical / Trunk Assessment Cervical / Trunk Assessment: Normal  Communication   Communication: No difficulties  Cognition Arousal/Alertness: Awake/alert Behavior During Therapy: WFL for tasks assessed/performed Overall Cognitive Status: Within Functional Limits for tasks assessed                                         General Comments      Exercises     Assessment/Plan    PT Assessment Patient needs continued PT services  PT Problem List Decreased strength;Decreased activity tolerance;Decreased balance;Decreased mobility       PT Treatment Interventions DME instruction;Stair training;Gait training;Functional mobility training;Therapeutic activities;Therapeutic exercise;Balance training;Patient/family education    PT Goals (Current goals can be found in the Care Plan section)  Acute Rehab PT Goals Patient Stated Goal: to get home today PT Goal Formulation: With patient/family Time For Goal Achievement: 08/25/17 Potential to Achieve Goals: Good    Frequency Min 3X/week   Barriers to discharge        Co-evaluation               AM-PAC PT "6 Clicks" Daily Activity  Outcome Measure Difficulty turning over in bed (including adjusting bedclothes, sheets and blankets)?: None Difficulty moving from lying on back to sitting on the side of the bed? : None Difficulty sitting down on and standing up from a chair with arms (e.g., wheelchair, bedside commode, etc,.)?: A Little Help needed moving to and from a bed to chair (including a wheelchair)?: A Little Help needed walking in hospital room?: A Little Help needed climbing 3-5 steps with a railing? : A Little 6 Click Score: 20    End of Session Equipment Utilized During Treatment: Gait belt Activity Tolerance: Patient tolerated treatment well Patient left: in bed;with call bell/phone within reach;with family/visitor present Nurse Communication: Mobility status PT Visit Diagnosis: Unsteadiness on feet (R26.81);Muscle weakness (generalized) (M62.81)    Time: 3664-4034 PT Time Calculation (min) (ACUTE ONLY): 19 min   Charges:   PT Evaluation $PT Eval Moderate Complexity: 1 Mod     PT G Codes:   PT G-Codes **NOT FOR INPATIENT CLASS** Functional Assessment Tool Used: AM-PAC 6 Clicks Basic Mobility;Clinical judgement Functional  Limitation: Mobility: Walking and moving around Mobility: Walking and Moving Around Current Status (V4259): At least 20 percent but less than 40 percent impaired, limited or restricted Mobility: Walking and Moving Around Goal Status [phone]): At least 1 percent but less than 20 percent impaired, limited or restricted    Scheryl Marten PT, DPT  [phone]   Shanon Rosser 08/18/2017, 1:40 PM

## 2017-08-18 NOTE — Progress Notes (Signed)
PROGRESS NOTE    Sarah Huffman  ZOX:[id number] DOB: 1946/02/20 DOA: 08/17/2017 PCP: Dyann Ruddle, MD   Chief Complaint  Patient presents with  . Headache    Brief Narrative:  HPI on 08/17/2017 by Dr. Christia Reading Opyd Sarah Huffman is a 71 y.o. female with medical history significant for hypertension, chronic pain, and depression with anxiety, now presenting to the emergency department for evaluation of progressive confusion and generalized weakness. Patient had a similar presentation to the emergency department couple weeks ago, her condition was attributed to dehydration with orthostasis, and she improved with IV fluid hydration. Now, over the past 4 days, symptoms have returned, and have progressively worsened. Patient has not been expressing any specific complaints. Husband is noted her to be confused and generally weak, with difficulty ambulating. She has not been vomiting and does not seem to be coughing or in any respiratory distress. She is not known to use alcohol or illicit substances.  Assessment & Plan   Severe sepsis secondary to UTI -Patient presented with leukocytosis, fever, acute kidney injury -UA suggestive of infection -Patient recently treated for Escherichia coli UTI earlier this month which was pansensitive -Patient currently denies dysuria or pyuria. She does complain of frequency. -Discussed urological follow-up upon discharge as patient may have more incontinence issues. -Pending urine and blood cultures -Continue ceftriaxone  Acute kidney injury on chronic kidney disease, stage III -Likely secondary to sepsis -Creatinine was 1.71 upon admission -Creatinine now 1.46  -Upon review of Epic, creatinine was 1.24 earlier this month however in 2010 GFR was stage III -Continue IV fluids and monitor BMP  Acute encephalopathy -CT head unremarkable for acute intracranial normality -Ammonia WNL -Suspect encephalopathy secondary to sepsis and UTI -Appears that mental  status has improved  Right arm weakness -Resolved prior to admission  Hyponatremia -Suspect secondary to hypovolemia from sepsis, Sodium upon admission 126, currently on normal saline -Sodium 128 today -Continue to monitor BMP -HCTZ held  Hypokalemia -Will replace and continue to monitor BMP  Essential hypertension -Triamterene/HCTZ and lisinopril held due to acute kidney injury -Blood pressure currently stable -Continue IV hydralazine as needed  Depression/anxiety -Continue Cymbalta, Valium  Hyperlipidemia -Continue statin  DVT Prophylaxis  heparin  Code Status: Full  Family Communication: None at bedside  Disposition Plan: observation, pending improvement and urine culture results  Consultants none  Procedures  none  Antibiotics   Anti-infectives    Start     Dose/Rate Route Frequency Ordered Stop   08/18/17 1000  cefTRIAXone (ROCEPHIN) 1 g in dextrose 5 % 50 mL IVPB     1 g 100 mL/hr over 30 Minutes Intravenous Every 24 hours 08/17/17 1943     08/17/17 1530  cefTRIAXone (ROCEPHIN) 1 g in dextrose 5 % 50 mL IVPB     1 g 100 mL/hr over 30 Minutes Intravenous  Once 08/17/17 1516 08/17/17 1606      Subjective:   Sarah Huffman seen and examined today.  Patient complains of feeling weak and tired. Would like to go home. Complains about not being able to hold her urine. Denies chest pain, shortness breath, abdominal pain, nausea or vomiting, diarrhea or constipation.  Objective:   Vitals:   08/17/17 2142 08/18/17 0500 08/18/17 0524 08/18/17 1401  BP: (!) 96/56  123/74 124/83  Pulse: 83  92 87  Resp: 19  18 17   Temp: 98.7 F (37.1 C)  98.9 F (37.2 C) 98.1 F (36.7 C)  TempSrc: Oral  Oral Oral  SpO2: 99%  97%  Weight:  64.5 kg (142 lb 1.6 oz)    Height:        Intake/Output Summary (Last 24 hours) at 08/18/17 1539 Last data filed at 08/18/17 0431  Gross per 24 hour  Intake           709.33 ml  Output                0 ml  Net           709.33  ml   Filed Weights   08/17/17 1239 08/18/17 0500  Weight: 63.5 kg (140 lb) 64.5 kg (142 lb 1.6 oz)    Exam  General: Well developed, well nourished, NAD, appears stated age  43: NCAT,  mucous membranes moist.   Cardiovascular: S1 S2 auscultated, RRR, no murmurs  Respiratory: Clear to auscultation bilaterally with equal chest rise  Abdomen: Soft, nontender, nondistended, + bowel sounds  Extremities: warm dry without cyanosis clubbing or edema  Neuro: AAOx3, nonfocal  Psych: Appropriate   Data Reviewed: I have personally reviewed following labs and imaging studies  CBC:  Recent Labs Lab 08/17/17 1309 08/18/17 0348  WBC 13.8* 7.8  NEUTROABS 12.2* 6.2  HGB 11.8* 10.8*  HCT 35.3* 32.2*  MCV 92.4 90.4  PLT 189 831   Basic Metabolic Panel:  Recent Labs Lab 08/17/17 1309 08/18/17 0348  NA 126* 128*  K 3.3* 3.2*  CL 86* 95*  CO2 26 24  GLUCOSE 102* 87  BUN 26* 23*  CREATININE 1.71* 1.46*  CALCIUM 9.2 8.1*   GFR: Estimated Creatinine Clearance: 31 mL/min (A) (by C-G formula based on SCr of 1.46 mg/dL (H)). Liver Function Tests:  Recent Labs Lab 08/17/17 1309  AST 22  ALT 19  ALKPHOS 87  BILITOT 0.5  PROT 7.9  ALBUMIN 3.7   No results for input(s): LIPASE, AMYLASE in the last 168 hours.  Recent Labs Lab 08/17/17 1348  AMMONIA <9*   Coagulation Profile: No results for input(s): INR, PROTIME in the last 168 hours. Cardiac Enzymes:  Recent Labs Lab 08/17/17 1348  TROPONINI <0.03   BNP (last 3 results) No results for input(s): PROBNP in the last 8760 hours. HbA1C: No results for input(s): HGBA1C in the last 72 hours. CBG:  Recent Labs Lab 08/17/17 1347  GLUCAP 98   Lipid Profile: No results for input(s): CHOL, HDL, LDLCALC, TRIG, CHOLHDL, LDLDIRECT in the last 72 hours. Thyroid Function Tests: No results for input(s): TSH, T4TOTAL, FREET4, T3FREE, THYROIDAB in the last 72 hours. Anemia Panel: No results for input(s):  VITAMINB12, FOLATE, FERRITIN, TIBC, IRON, RETICCTPCT in the last 72 hours. Urine analysis:    Component Value Date/Time   COLORURINE YELLOW 08/17/2017 1437   APPEARANCEUR CLOUDY (A) 08/17/2017 1437   LABSPEC 1.017 08/17/2017 1437   PHURINE 6.0 08/17/2017 1437   GLUCOSEU NEGATIVE 08/17/2017 1437   HGBUR MODERATE (A) 08/17/2017 1437   BILIRUBINUR NEGATIVE 08/17/2017 1437   KETONESUR NEGATIVE 08/17/2017 1437   PROTEINUR 100 (A) 08/17/2017 1437   UROBILINOGEN 0.2 09/14/2009 2220   NITRITE POSITIVE (A) 08/17/2017 1437   LEUKOCYTESUR MODERATE (A) 08/17/2017 1437   Sepsis Labs: @LABRCNTIP (procalcitonin:4,lacticidven:4)  ) Recent Results (from the past 240 hour(s))  Blood Cultures (routine x 2)     Status: None (Preliminary result)   Collection Time: 08/17/17  1:50 PM  Result Value Ref Range Status   Specimen Description BLOOD BLOOD RIGHT ARM  Final   Special Requests   Final    BOTTLES DRAWN AEROBIC AND  ANAEROBIC Blood Culture adequate volume   Culture   Final    NO GROWTH < 24 HOURS Performed at [facility], [place] [address], [place], [place] [address]    Report Status PENDING  Incomplete  Blood Cultures (routine x 2)     Status: None (Preliminary result)   Collection Time: 08/17/17  2:19 PM  Result Value Ref Range Status   Specimen Description BLOOD BLOOD RIGHT WRIST  Final   Special Requests   Final    BOTTLES DRAWN AEROBIC AND ANAEROBIC Blood Culture results may not be optimal due to an inadequate volume of blood received in culture bottles   Culture   Final    NO GROWTH < 24 HOURS Performed at [facility], [place] [address] [place], [place] [address]    Report Status PENDING  Incomplete      Radiology Studies: Dg Chest 2 View  Result Date: 08/17/2017 CLINICAL DATA:  Altered mental status. The patient fell last night. Unsteady gait. Headache. EXAM: CHEST  2 VIEW COMPARISON:  None. FINDINGS: Heart size and pulmonary vascularity are normal and the lungs are  clear except for slight accentuation of the interstitial markings at the bases in a tiny calcified granuloma in the right midzone. No effusions. No acute bone abnormality. IMPRESSION: No significant abnormality. Probable chronic accentuation of the interstitial markings at the bases. Electronically Signed   By: Lorriane Shire M.D.   On: 08/17/2017 13:39   Ct Head Wo Contrast  Result Date: 08/17/2017 CLINICAL DATA:  Headache and altered level of consciousness. EXAM: CT HEAD WITHOUT CONTRAST TECHNIQUE: Contiguous axial images were obtained from the base of the skull through the vertex without intravenous contrast. COMPARISON:  07/30/2017 FINDINGS: Brain: No evidence of acute infarction, hemorrhage, hydrocephalus, or mass. Chronic widening of the interhemispheric fissure, possible mild hygroma. There is atrophy with sulcal widening greatest in the frontal lobes. Partially empty sella, incidental to the history. Vascular: No hyperdense vessel or unexpected calcification. Skull: No acute or aggressive finding Sinuses/Orbits: Negative IMPRESSION: No acute finding or change from prior. Electronically Signed   By: Monte Fantasia M.D.   On: 08/17/2017 13:29   US Renal  Result Date: 08/18/2017 CLINICAL DATA:  Acute kidney injury, UTI EXAM: RENAL / URINARY TRACT ULTRASOUND COMPLETE COMPARISON:  None. FINDINGS: Right Kidney: Length: 9.3 cm. Echogenicity within normal limits. No mass or hydronephrosis visualized. Left Kidney: Length: 10.5 cm. Echogenicity within normal limits. No mass or hydronephrosis visualized. Bladder: Appears normal for degree of bladder distention. IMPRESSION: No acute findings.  No hydronephrosis. Electronically Signed   By: Rolm Baptise M.D.   On: 08/18/2017 14:41     Scheduled Meds: . gabapentin  800 mg Oral BID  . heparin  5,000 Units Subcutaneous Q8H  . simvastatin  20 mg Oral q1800  . sodium chloride flush  3 mL Intravenous Q12H   Continuous Infusions: . sodium chloride 75 mL/hr  at 08/18/17 1230  . cefTRIAXone (ROCEPHIN)  IV Stopped (08/18/17 0957)     LOS: 0 days   Time Spent in minutes   30 minutes  Chaunte Hornbeck D.O. on 08/18/2017 at 3:39 PM  Between 7am to 7pm - Pager - [phone]  After 7pm go to [email or web address] - password [email or web address]  And look for the night coverage person covering for me after hours  [facility] Office  [phone]

## 2017-08-19 DIAGNOSIS — N12 Tubulo-interstitial nephritis, not specified as acute or chronic: Secondary | ICD-10-CM | POA: Diagnosis not present

## 2017-08-19 DIAGNOSIS — R29898 Other symptoms and signs involving the musculoskeletal system: Secondary | ICD-10-CM | POA: Diagnosis not present

## 2017-08-19 DIAGNOSIS — N390 Urinary tract infection, site not specified: Secondary | ICD-10-CM | POA: Diagnosis not present

## 2017-08-19 DIAGNOSIS — G8929 Other chronic pain: Secondary | ICD-10-CM | POA: Diagnosis not present

## 2017-08-19 DIAGNOSIS — E876 Hypokalemia: Secondary | ICD-10-CM | POA: Diagnosis not present

## 2017-08-19 DIAGNOSIS — A419 Sepsis, unspecified organism: Secondary | ICD-10-CM | POA: Diagnosis not present

## 2017-08-19 DIAGNOSIS — F418 Other specified anxiety disorders: Secondary | ICD-10-CM | POA: Diagnosis not present

## 2017-08-19 DIAGNOSIS — G9340 Encephalopathy, unspecified: Secondary | ICD-10-CM | POA: Diagnosis not present

## 2017-08-19 DIAGNOSIS — R410 Disorientation, unspecified: Secondary | ICD-10-CM

## 2017-08-19 DIAGNOSIS — I10 Essential (primary) hypertension: Secondary | ICD-10-CM | POA: Diagnosis not present

## 2017-08-19 DIAGNOSIS — N179 Acute kidney failure, unspecified: Secondary | ICD-10-CM | POA: Diagnosis not present

## 2017-08-19 DIAGNOSIS — E871 Hypo-osmolality and hyponatremia: Secondary | ICD-10-CM | POA: Diagnosis not present

## 2017-08-19 LAB — BASIC METABOLIC PANEL
Anion gap: 9 (ref 5–15)
BUN: 12 mg/dL (ref 6–20)
CALCIUM: 8.3 mg/dL — AB (ref 8.9–10.3)
CO2: 23 mmol/L (ref 22–32)
CREATININE: 1.07 mg/dL — AB (ref 0.44–1.00)
Chloride: 99 mmol/L — ABNORMAL LOW (ref 101–111)
GFR calc Af Amer: 60 mL/min — ABNORMAL LOW (ref >60)
GFR, EST NON AFRICAN AMERICAN: 51 mL/min — AB (ref >60)
GLUCOSE: 82 mg/dL (ref 65–99)
Potassium: 4.2 mmol/L (ref 3.5–5.1)
Sodium: 131 mmol/L — ABNORMAL LOW (ref 135–145)

## 2017-08-19 LAB — CBC
HEMATOCRIT: 31.2 % — AB (ref 36.0–46.0)
Hemoglobin: 10.2 g/dL — ABNORMAL LOW (ref 12.0–15.0)
MCH: 29.7 pg (ref 26.0–34.0)
MCHC: 32.7 g/dL (ref 30.0–36.0)
MCV: 91 fL (ref 78.0–100.0)
Platelets: 185 10*3/uL (ref 150–400)
RBC: 3.43 MIL/uL — ABNORMAL LOW (ref 3.87–5.11)
RDW: 14.2 % (ref 11.5–15.5)
WBC: 5.2 10*3/uL (ref 4.0–10.5)

## 2017-08-19 MED ORDER — CIPROFLOXACIN HCL 500 MG PO TABS: 500.0000 mg | ORAL_TABLET | Freq: Two times a day (BID) | ORAL | 0 refills | Status: AC

## 2017-08-19 NOTE — Discharge Summary (Addendum)
Physician Discharge Summary  Sarah Huffman IRW:[id number] DOB: [birth year] DOA: 08/17/2017  PCP: Dyann Ruddle, MD  Admit date: 08/17/2017 Discharge date: 08/19/2017  Time spent: 45 minutes  Recommendations for Outpatient Follow-up:  Patient will be discharged to home.  Patient will need to follow up with primary care provider within one week of discharge, repeat BMP. Discuss gabapentin doses and blood pressure medications and possibly urology referral.  Eat probiotics while taking antibiotics. Patient should continue medications as prescribed.  Patient should follow a heart healthy diet.   Discharge Diagnoses:  Severe sepsis secondary to UTI Acute kidney injury on chronic kidney disease, stage III Acute encephalopathy Right arm weakness Hyponatremia Hypokalemia Essential hypertension Depression/anxiety Hyperlipidemia  Discharge Condition: stable  Diet recommendation: heart healthy  Filed Weights   08/17/17 1239 08/18/17 0500 08/19/17 0519  Weight: 63.5 kg (140 lb) 64.5 kg (142 lb 1.6 oz) 68.1 kg (150 lb 1.6 oz)    History of present illness:  on 08/17/2017 by Dr. Theda Belfast Smithis a 71 y.o.femalewith medical history significant for hypertension, chronic pain, and depression with anxiety, now presenting to the emergency department for evaluation of progressive confusion and generalized weakness. Patient had a similar presentation to the emergency department couple weeks ago, her condition was attributed to dehydration with orthostasis, and she improved with IV fluid hydration. Now, over the past 4 days, symptoms have returned, and have progressively worsened. Patient has not been expressing any specific complaints. Husband is noted her to be confused and generally weak, with difficulty ambulating. She has not been vomiting and does not seem to be coughing or in any respiratory distress. She is not known to use alcohol or illicit substances.  Hospital Course:  Severe  sepsis secondary to UTI -Patient presented with leukocytosis, fever, acute kidney injury -UA suggestive of infection -Patient recently treated for Escherichia coli/ aerococcus urinae UTI earlier this month which was pansensitive. She was treated with Keflex but does not feel the antibiotic helped. -Complains of frequency and inability to control her bladder.  -Has been instructed on changing out depends/pads every 2 hours if possible, appropriate way to clean/wipe her bottom, and possible urologic follow up. Diuretics may also be adding to patient's frequency -Blood cultures show no growth to date -Urine culture pending and can be followed up on by PCP -was initially placed on ceftriaxone -Case discussed with pharmacy/ID pharmacy- recommended either Bactrim or Cipro. Given that patient has CKD at baseline, will discharge with cipro and instruct patient to take probiotics.   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 Otherwise negative noncontrast MRI of the head for age. -Appears that mental status has improved, patient AAOx3 -Large doses of gabapentin can affect also affect her cognition. Discussed with husband, may be beneficial to taper the dose.   Right arm weakness -Resolved prior to admission  Hyponatremia -Suspect secondary to hypovolemia from sepsis, Sodium upon admission 126, currently on normal saline -Sodium 131 today -Continue to monitor BMP -Triamterene/HCTZ held  Hypokalemia -Resolved, repeat BMP in one week  Essential  hypertension -Triamterene/HCTZ and lisinopril held due to acute kidney injury -Blood pressure currently stable off  of antihypertensive  -Discuss restarting medications with PCP. 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 She denies chest pain, shortness of breath, abdominal pain, nausea, vomiting, headache, or dizziness. States she is unable to control her bladder and uses depends.    General: Well developed, well nourished, NAD, appears stated age  HEENT: NCAT, mucous membranes moist.  Cardiovascular: S1 S2 auscultated, no rubs, murmurs or gallops. Regular rate and rhythm.  Respiratory: Clear to auscultation bilaterally with equal chest rise  Abdomen: Soft, nontender, nondistended, + bowel sounds  Extremities: warm dry without cyanosis clubbing or edema  Neuro: AAOx3, nonfocal   Psych: Appropriate, pleasant  Discharge Instructions Discharge Instructions    Discharge instructions    Complete by:  As directed    Patient will be discharged to home.  Patient will need to follow up with primary care provider within one week of discharge, repeat BMP. Discuss gabapentin doses and blood pressure medications and possibly urology referral.  Eat probiotics while taking antibiotics. Patient should continue medications as prescribed.  Patient should follow a heart healthy diet.     Current Discharge Medication List    START taking these medications   Details  ciprofloxacin (CIPRO) 500 MG tablet Take 1 tablet (500 mg total) by mouth 2 (two) times daily. Qty:  14 tablet, Refills: 0      CONTINUE these medications which have NOT CHANGED   Details  alendronate (FOSAMAX) 70 MG tablet Take 70 mg by mouth once a week. Take with a full glass of water on an empty stomach.    aspirin EC 81 MG tablet Take 81 mg by mouth daily.    b complex vitamins tablet Take 1 tablet by mouth daily.    diazepam (VALIUM) 5 MG tablet Take 5 mg by mouth 2 (two) times daily as needed for anxiety.     DULoxetine (CYMBALTA) 30 MG capsule Take 30 mg by mouth at bedtime.     gabapentin (NEURONTIN) 400 MG capsule Take 800 mg by mouth 4 (four) times daily.    lisinopril (PRINIVIL,ZESTRIL) 10 MG tablet Take 5 mg by mouth daily.     simvastatin (ZOCOR) 20 MG tablet Take 20 mg by mouth at bedtime.     traMADol (ULTRAM) 50 MG tablet Take 50 mg by mouth 3 (three) times daily.       STOP taking these medications     ibuprofen (ADVIL,MOTRIN) 200 MG tablet      triamterene-hydrochlorothiazide (DYAZIDE) 37.5-25 MG capsule        Allergies  Allergen Reactions  . Amitriptyline Other (See Comments)    Syncope  . Codeine Other (See Comments)    Family History of an allergy (caused patient's mother to "climb the walls")  . Ivp Dye [Iodinated Diagnostic Agents] Other (See Comments) and Hypertension    Began to sweat profusely (also)  . Meloxicam Swelling    "Swelling" cited as the reaction, but patient doesn't recall this   Follow-up Information    Dyann Ruddle, MD. Schedule an appointment as soon as possible for a visit in 1 week(s).  Specialty:  Internal Medicine Why:  Hospital follow up Contact information: [address] [place] [address] [phone]            The results of significant diagnostics from this hospitalization (including imaging, microbiology, ancillary and laboratory) are listed below for reference.    Significant Diagnostic Studies: Dg Chest 2 View  Result Date: 08/17/2017 CLINICAL DATA:  Altered mental status.  The patient fell last night. Unsteady gait. Headache. EXAM: CHEST  2 VIEW COMPARISON:  None. FINDINGS: Heart size and pulmonary vascularity are normal and the lungs are clear except for slight accentuation of the interstitial markings at the bases in a tiny calcified granuloma in the right midzone. No effusions. No acute bone abnormality. IMPRESSION: No significant abnormality. Probable chronic accentuation of the interstitial markings at the bases. Electronically Signed   By: Lorriane Shire M.D.   On: 08/17/2017 13:39   Ct Head Wo Contrast  Result Date: 08/17/2017 CLINICAL DATA:  Headache and altered level of consciousness. EXAM: CT HEAD WITHOUT CONTRAST TECHNIQUE: Contiguous axial images were obtained from the base of the skull through the vertex without intravenous contrast. COMPARISON:  07/30/2017 FINDINGS: Brain: No evidence of acute infarction, hemorrhage, hydrocephalus, or mass. Chronic widening of the interhemispheric fissure, possible mild hygroma. There is atrophy with sulcal widening greatest in the frontal lobes. Partially empty sella, incidental to the history. Vascular: No hyperdense vessel or unexpected calcification. Skull: No acute or aggressive finding Sinuses/Orbits: Negative IMPRESSION: No acute finding or change from prior. Electronically Signed   By: Monte Fantasia M.D.   On: 08/17/2017 13:29   Ct Head Wo Contrast  Result Date: 07/30/2017 CLINICAL DATA:  Multiple falls, syncopal episodes. Tired. History of hypertension and hypercholesterolemia. EXAM: CT HEAD WITHOUT CONTRAST TECHNIQUE: Contiguous axial images were obtained from the base of the skull through the vertex without intravenous contrast. COMPARISON:  CT HEAD September 14, 2009 FINDINGS: BRAIN: No intraparenchymal hemorrhage, mass effect nor midline shift. The ventricles and sulci are normal for age. Patchy supratentorial white matter hypodensities less than expected for patient's age, though non-specific are most compatible  with chronic small vessel ischemic disease. No acute large vascular territory infarcts. Symmetrically prominent bifrontal parafalcine extra-axial spaces. Basal cisterns are patent. VASCULAR: Mild calcific atherosclerosis of the carotid siphons. SKULL: No skull fracture. No significant scalp soft tissue swelling. SINUSES/ORBITS: The mastoid air-cells and included paranasal sinuses are well-aerated.The included ocular globes and orbital contents are non-suspicious. OTHER: None. IMPRESSION: 1. No acute intracranial process. 2. 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 3. Otherwise negative noncontrast CT HEAD for age. Electronically Signed   By: Elon Alas M.D.   On: 07/30/2017 23:28   Mr Brain Wo Contrast  Result Date: 08/18/2017 CLINICAL DATA:  Progressive confusion, generalized weakness. Orthostatic hypotension. History of hypertension, hypercholesterolemia. EXAM: MRI HEAD WITHOUT CONTRAST TECHNIQUE: Multiplanar, multiecho pulse sequences of the brain and surrounding structures were obtained without intravenous contrast. COMPARISON:  CT HEAD August 17, 2017 FINDINGS: BRAIN: No reduced diffusion to suggest acute ischemia. No susceptibility artifact to suggest hemorrhage. The ventricles and sulci are normal for patient's age. Patchy supratentorial white matter FLAIR T2 hyperintensities, less than expected for age compatible with mild chronic small vessel ischemic disease. No suspicious parenchymal signal, mass or mass effect. Prominent medial bifrontal extra-axial fluid spaces. VASCULAR: Normal major intracranial vascular flow voids present at skull base. Arachnoid granulations RIGHT transverse dural venous sinus. SKULL AND UPPER CERVICAL SPINE: No abnormal sellar expansion. No suspicious calvarial bone marrow signal.  Craniocervical junction maintained. SINUSES/ORBITS: The mastoid air-cells and included paranasal sinuses are well-aerated. The included ocular globes and orbital contents are  non-suspicious. OTHER: None. IMPRESSION: 1. 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 2. Otherwise negative noncontrast MRI of the head for age. Electronically Signed   By: Elon Alas M.D.   On: 08/18/2017 19:04   US Renal  Result Date: 08/18/2017 CLINICAL DATA:  Acute kidney injury, UTI EXAM: RENAL / URINARY TRACT ULTRASOUND COMPLETE COMPARISON:  None. FINDINGS: Right Kidney: Length: 9.3 cm. Echogenicity within normal limits. No mass or hydronephrosis visualized. Left Kidney: Length: 10.5 cm. Echogenicity within normal limits. No mass or hydronephrosis visualized. Bladder: Appears normal for degree of bladder distention. IMPRESSION: No acute findings.  No hydronephrosis. Electronically Signed   By: Rolm Baptise M.D.   On: 08/18/2017 14:41    Microbiology: Recent Results (from the past 240 hour(s))  Blood Cultures (routine x 2)     Status: None (Preliminary result)   Collection Time: 08/17/17  1:50 PM  Result Value Ref Range Status   Specimen Description BLOOD BLOOD RIGHT ARM  Final   Special Requests   Final    BOTTLES DRAWN AEROBIC AND ANAEROBIC Blood Culture adequate volume   Culture   Final    NO GROWTH < 24 HOURS Performed at [facility], [address] [place], [place] [address]    Report Status PENDING  Incomplete  Blood Cultures (routine x 2)     Status: None (Preliminary result)   Collection Time: 08/17/17  2:19 PM  Result Value Ref Range Status   Specimen Description BLOOD BLOOD RIGHT WRIST  Final   Special Requests   Final    BOTTLES DRAWN AEROBIC AND ANAEROBIC Blood Culture results may not be optimal due to an inadequate volume of blood received in culture bottles   Culture   Final    NO GROWTH < 24 HOURS Performed at [facility], [employer] [address], [place],  [address]    Report Status PENDING  Incomplete     Labs: Basic Metabolic Panel:  Recent Labs Lab 08/17/17 1309 08/18/17 0348 08/19/17 0601  NA 126* 128* 131*  K 3.3*  3.2* 4.2  CL 86* 95* 99*  CO2 26 24 23   GLUCOSE 102* 87 82  BUN 26* 23* 12  CREATININE 1.71* 1.46* 1.07*  CALCIUM 9.2 8.1* 8.3*   Liver Function Tests:  Recent Labs Lab 08/17/17 1309  AST 22  ALT 19  ALKPHOS 87  BILITOT 0.5  PROT 7.9  ALBUMIN 3.7   No results for input(s): LIPASE, AMYLASE in the last 168 hours.  Recent Labs Lab 08/17/17 1348  AMMONIA <9*   CBC:  Recent Labs Lab 08/17/17 1309 08/18/17 0348 08/19/17 0601  WBC 13.8* 7.8 5.2  NEUTROABS 12.2* 6.2  --   HGB 11.8* 10.8* 10.2*  HCT 35.3* 32.2* 31.2*  MCV 92.4 90.4 91.0  PLT 189 173 185   Cardiac Enzymes:  Recent Labs Lab 08/17/17 1348  TROPONINI <0.03   BNP: BNP (last 3 results) No results for input(s): BNP in the last 8760 hours.  ProBNP (last 3 results) No results for input(s): PROBNP in the last 8760 hours.  CBG:  Recent Labs Lab 08/17/17 1347  GLUCAP 98       Signed:  Ezrie Bunyan  [facility] 08/19/2017, 3:08 PM

## 2017-08-19 NOTE — Care Management Obs Status (Signed)
[facility] NOTIFICATION   Patient Details  Name: Tamar Miano MRN: [id number] Date of Birth: 1946/06/16   Medicare Observation Status Notification Given:  Yes    Ella Bodo, RN 08/19/2017, 3:32 PM

## 2017-08-19 NOTE — Discharge Instructions (Signed)
Urinary Tract Infection, Adult A urinary tract infection (UTI) is an infection of any part of the urinary tract, which includes the kidneys, ureters, bladder, and urethra. These organs make, store, and get rid of urine in the body. UTI can be a bladder infection (cystitis) or kidney infection (pyelonephritis). What are the causes? This infection may be caused by fungi, viruses, or bacteria. Bacteria are the most common cause of UTIs. This condition can also be caused by repeated incomplete emptying of the bladder during urination. What increases the risk? This condition is more likely to develop if:  You ignore your need to urinate or hold urine for long periods of time.  You do not empty your bladder completely during urination.  You wipe back to front after urinating or having a bowel movement, if you are female.  You are uncircumcised, if you are female.  You are constipated.  You have a urinary catheter that stays in place (indwelling).  You have a weak defense (immune) system.  You have a medical condition that affects your bowels, kidneys, or bladder.  You have diabetes.  You take antibiotic medicines frequently or for long periods of time, and the antibiotics no longer work well against certain types of infections (antibiotic resistance).  You take medicines that irritate your urinary tract.  You are exposed to chemicals that irritate your urinary tract.  You are female.  What are the signs or symptoms? Symptoms of this condition include:  Fever.  Frequent urination or passing small amounts of urine frequently.  Needing to urinate urgently.  Pain or burning with urination.  Urine that smells bad or unusual.  Cloudy urine.  Pain in the lower abdomen or back.  Trouble urinating.  Blood in the urine.  Vomiting or being less hungry than normal.  Diarrhea or abdominal pain.  Vaginal discharge, if you are female.  How is this diagnosed? This condition is  diagnosed with a medical history and physical exam. You will also need to provide a urine sample to test your urine. Other tests may be done, including:  Blood tests.  Sexually transmitted disease (STD) testing.  If you have had more than one UTI, a cystoscopy or imaging studies may be done to determine the cause of the infections. How is this treated? Treatment for this condition often includes a combination of two or more of the following:  Antibiotic medicine.  Other medicines to treat less common causes of UTI.  Over-the-counter medicines to treat pain.  Drinking enough water to stay hydrated.  Follow these instructions at home:  Take over-the-counter and prescription medicines only as told by your health care provider.  If you were prescribed an antibiotic, take it as told by your health care provider. Do not stop taking the antibiotic even if you start to feel better.  Avoid alcohol, caffeine, tea, and carbonated beverages. They can irritate your bladder.  Drink enough fluid to keep your urine clear or pale yellow.  Keep all follow-up visits as told by your health care provider. This is important.  Make sure to: ? Empty your bladder often and completely. Do not hold urine for long periods of time. ? Empty your bladder before and after sex. ? Wipe from front to back after a bowel movement if you are female. Use each tissue one time when you wipe. Contact a health care provider if:  You have back pain.  You have a fever.  You feel nauseous or vomit.  Your symptoms do not  get better after 3 days.  Your symptoms go away and then return. Get help right away if:  You have severe back pain or lower abdominal pain.  You are vomiting and cannot keep down any medicines or water. This information is not intended to replace advice given to you by your health care provider. Make sure you discuss any questions you have with your health care provider. Document Released:  09/22/2005 Document Revised: 05/26/2016 Document Reviewed: 11/03/2015 Elsevier Interactive Patient Education  2017 Elsevier Inc.  Acute Kidney Injury, Adult Acute kidney injury is a sudden worsening of kidney function. The kidneys are organs that have several jobs. They filter the blood to remove waste products and extra fluid. They also maintain a healthy balance of minerals and hormones in the body, which helps control blood pressure and keep bones strong. With this condition, your kidneys do not do their jobs as well as they should. This condition ranges from mild to severe. Over time it may develop into long-lasting (chronic) kidney disease. Early detection and treatment may prevent acute kidney injury from developing into a chronic condition. What are the causes? Common causes of this condition include:  A problem with blood flow to the kidneys. This may be caused by: ? Low blood pressure (hypotension) or shock. ? Blood loss. ? Heart and blood vessel (cardiovascular) disease. ? Severe burns. ? Liver disease.  Direct damage to the kidneys. This may be caused by: ? Certain medicines. ? A kidney infection. ? Poisoning. ? Being around or in contact with toxic substances. ? A surgical wound. ? A hard, direct hit to the kidney area.  A sudden blockage of urine flow. This may be caused by: ? Cancer. ? Kidney stones. ? An enlarged prostate in males.  What are the signs or symptoms? Symptoms of this condition may not be obvious until the condition becomes severe. Symptoms of this condition can include:  Tiredness (lethargy), or difficulty staying awake.  Nausea or vomiting.  Swelling (edema) of the face, legs, ankles, or feet.  Problems with urination, such as: ? Abdominal pain, or pain along the side of your stomach (flank). ? Decreased urine production. ? Decrease in the force of urine flow.  Muscle twitches and cramps, especially in the legs.  Confusion or trouble  concentrating.  Loss of appetite.  Fever.  How is this diagnosed? This condition may be diagnosed with tests, including:  Blood tests.  Urine tests.  Imaging tests.  A test in which a sample of tissue is removed from the kidneys to be examined under a microscope (kidney biopsy).  How is this treated? Treatment for this condition depends on the cause and how severe the condition is. In mild cases, treatment may not be needed. The kidneys may heal on their own. In more severe cases, treatment will involve:  Treating the cause of the kidney injury. This may involve changing any medicines you are taking or adjusting your dosage.  Fluids. You may need specialized IV fluids to balance your body's needs.  Having a catheter placed to drain urine and prevent blockages.  Preventing problems from occurring. This may mean avoiding certain medicines or procedures that can cause further injury to the kidneys.  In some cases treatment may also require:  A procedure to remove toxic wastes from the body (dialysis or continuous renal replacement therapy - CRRT).  Surgery. This may be done to repair a torn kidney, or to remove the blockage from the urinary system.  Follow these  instructions at home: Medicines  Take over-the-counter and prescription medicines only as told by your health care provider.  Do not take any new medicines without your health care provider's approval. Many medicines can worsen your kidney damage.  Do not take any vitamin and mineral supplements without your health care provider's approval. Many nutritional supplements can worsen your kidney damage. Lifestyle  If your health care provider prescribed changes to your diet, follow them. You may need to decrease the amount of protein you eat.  Achieve and maintain a healthy weight. If you need help with this, ask your health care provider.  Start or continue an exercise plan. Try to exercise at least 30 minutes a day,  5 days a week.  Do not use any tobacco products, such as cigarettes, chewing tobacco, and e-cigarettes. If you need help quitting, ask your health care provider. General instructions  Keep track of your blood pressure. Report changes in your blood pressure as told by your health care provider.  Stay up to date with immunizations. Ask your health care provider which immunizations you need.  Keep all follow-up visits as told by your health care provider. This is important. Where to find more information:  American Association of Kidney Patients: BombTimer.gl  National Kidney Foundation: www.kidney.[place]: https://mathis.com/  [facility]: ? www.lifeoptions.org ? www.kidneyschool.org Contact a health care provider if:  Your symptoms get worse.  You develop new symptoms. Get help right away if:  You develop symptoms of worsening kidney disease, which include: ? Headaches. ? Abnormally dark or light skin. ? Easy bruising. ? Frequent hiccups. ? Chest pain. ? Shortness of breath. ? End of menstruation in women. ? Seizures. ? Confusion or altered mental status. ? Abdominal or back pain. ? Itchiness.  You have a fever.  Your body is producing less urine.  You have pain or bleeding when you urinate. Summary  Acute kidney injury is a sudden worsening of kidney function.  Acute kidney injury can be caused by problems with blood flow to the kidneys, direct damage to the kidneys, and sudden blockage of urine flow.  Symptoms of this condition may not be obvious until it becomes severe. Symptoms may include edema, lethargy, confusion, nausea or vomiting, and problems passing urine.  This condition can usually be diagnosed with blood tests, urine tests, and imaging tests. Sometimes a kidney biopsy is done to diagnose this condition.  Treatment for this condition often involves treating the underlying cause. It is treated with fluids,  medicines, dialysis, diet changes, or surgery. This information is not intended to replace advice given to you by your health care provider. Make sure you discuss any questions you have with your health care provider. Document Released: 06/28/2011 Document Revised: 12/03/2016 Document Reviewed: 12/03/2016 Elsevier Interactive Patient Education  2017 Elsevier Inc.  Confusion Confusion is the inability to think with your usual speed or clarity. Confusion may come on quickly or slowly over time. How quickly the confusion comes on depends on the cause. Confusion can be due to any number of causes. What are the causes?  Concussion, head injury, or head trauma.  Seizures.  Stroke.  Fever.  Brain tumor.  Age related decreased brain function (dementia).  Heightened emotional states like rage or terror.  Mental illness in which the person loses the ability to determine what is real and what is not (hallucinations).  Infections such as a urinary tract infection (UTI).  Toxic effects from alcohol, drugs, or prescription medicines.  Dehydration and an imbalance of salts in the body (electrolytes).  Lack of sleep.  Low blood sugar (diabetes).  Low levels of oxygen from conditions such as chronic lung disorders.  Drug interactions or other medicine side effects.  Nutritional deficiencies, especially niacin, thiamine, vitamin C, or vitamin B.  Sudden drop in body temperature (hypothermia).  Change in routine, such as when traveling or hospitalized. What are the signs or symptoms? People often describe their thinking as cloudy or unclear when they are confused. Confusion can also include feeling disoriented. That means you are unaware of where or who you are. You may also not know what the date or time is. If confused, you may also have difficulty paying attention, remembering, and making decisions. Some people also act aggressively when they are confused. How is this diagnosed? The  medical evaluation of confusion may include:  Blood and urine tests.  X-rays.  Brain and nervous system tests.  Analyzing your brain waves (electroencephalogram or EEG).  Magnetic resonance imaging (MRI) of your head.  Computed tomography (CT) scan of your head.  Mental status tests in which your health care provider may ask many questions. Some of these questions may seem silly or strange, but they are a very important test to help diagnose and treat confusion.  How is this treated? An admission to the hospital may not be needed, but a person with confusion should not be left alone. Stay with a family member or friend until the confusion clears. Avoid alcohol, pain relievers, or sedative drugs until you have fully recovered. Do not drive until directed by your health care provider. Follow these instructions at home: What family and friends can do:  To find out if someone is confused, ask the person to state his or her name, age, and the date. If the person is unsure or answers incorrectly, he or she is confused.  Always introduce yourself, no matter how well the person knows you.  Often remind the person of his or her location.  Place a calendar and clock near the confused person.  Help the person with his or her medicines. You may want to use a pill box, an alarm as a reminder, or give the person each dose as prescribed.  Talk about current events and plans for the day.  Try to keep the environment calm, quiet, and peaceful.  Make sure the person keeps follow-up visits with his or her health care provider.  How is this prevented? Ways to prevent confusion:  Avoid alcohol.  Eat a balanced diet.  Get enough sleep.  Take medicine only as directed by your health care provider.  Do not become isolated. Spend time with other people and make plans for your days.  Keep careful watch on your blood sugar levels if you are diabetic.  Get help right away if:  You develop  severe headaches, repeated vomiting, seizures, blackouts, or slurred speech.  There is increasing confusion, weakness, numbness, restlessness, or personality changes.  You develop a loss of balance, have marked dizziness, feel uncoordinated, or fall.  You have delusions, hallucinations, or develop severe anxiety.  Your family members think you need to be rechecked. This information is not intended to replace advice given to you by your health care provider. Make sure you discuss any questions you have with your health care provider. Document Released: 01/20/2005 Document Revised: 07/02/2016 Document Reviewed: 01/18/2014 Elsevier Interactive Patient Education  [employer].

## 2017-08-19 NOTE — Progress Notes (Signed)
Patient was discharged home by MD order; discharged instructions  review and give to patient with care notes; IV DIC; patient will be escorted to the car by nurse via wheelchair.  

## 2017-08-20 LAB — URINE CULTURE: CULTURE: NO GROWTH

## 2017-08-22 LAB — CULTURE, BLOOD (ROUTINE X 2)
CULTURE: NO GROWTH
Culture: NO GROWTH
Special Requests: ADEQUATE

## 2017-12-18 ENCOUNTER — Emergency Department (HOSPITAL_BASED_OUTPATIENT_CLINIC_OR_DEPARTMENT_OTHER)
Admission: EM | Admit: 2017-12-18 | Discharge: 2017-12-18 | Disposition: A | Discharge status: Home / Self Care | Payer: Medicare HMO | Attending: Emergency Medicine | Admitting: Emergency Medicine

## 2017-12-18 ENCOUNTER — Other Ambulatory Visit: Payer: Self-pay

## 2017-12-18 ENCOUNTER — Encounter (HOSPITAL_BASED_OUTPATIENT_CLINIC_OR_DEPARTMENT_OTHER): Payer: Self-pay | Admitting: Emergency Medicine

## 2017-12-18 DIAGNOSIS — E876 Hypokalemia: Secondary | ICD-10-CM | POA: Insufficient documentation

## 2017-12-18 DIAGNOSIS — Z79899 Other long term (current) drug therapy: Secondary | ICD-10-CM | POA: Diagnosis not present

## 2017-12-18 DIAGNOSIS — R410 Disorientation, unspecified: Secondary | ICD-10-CM | POA: Diagnosis not present

## 2017-12-18 DIAGNOSIS — N3000 Acute cystitis without hematuria: Secondary | ICD-10-CM | POA: Diagnosis not present

## 2017-12-18 DIAGNOSIS — Z853 Personal history of malignant neoplasm of breast: Secondary | ICD-10-CM | POA: Diagnosis not present

## 2017-12-18 DIAGNOSIS — I10 Essential (primary) hypertension: Secondary | ICD-10-CM | POA: Diagnosis not present

## 2017-12-18 DIAGNOSIS — E7800 Pure hypercholesterolemia, unspecified: Secondary | ICD-10-CM | POA: Diagnosis not present

## 2017-12-18 DIAGNOSIS — R4182 Altered mental status, unspecified: Secondary | ICD-10-CM | POA: Diagnosis present

## 2017-12-18 DIAGNOSIS — Z7982 Long term (current) use of aspirin: Secondary | ICD-10-CM | POA: Diagnosis not present

## 2017-12-18 LAB — COMPREHENSIVE METABOLIC PANEL
ALT: 9 U/L — ABNORMAL LOW (ref 14–54)
ANION GAP: 10 (ref 5–15)
AST: 16 U/L (ref 15–41)
Albumin: 3.6 g/dL (ref 3.5–5.0)
Alkaline Phosphatase: 55 U/L (ref 38–126)
BILIRUBIN TOTAL: 0.5 mg/dL (ref 0.3–1.2)
BUN: 14 mg/dL (ref 6–20)
CHLORIDE: 104 mmol/L (ref 101–111)
CO2: 27 mmol/L (ref 22–32)
Calcium: 9.3 mg/dL (ref 8.9–10.3)
Creatinine, Ser: 1.03 mg/dL — ABNORMAL HIGH (ref 0.44–1.00)
GFR calc Af Amer: >60 mL/min (ref >60)
GFR, EST NON AFRICAN AMERICAN: 54 mL/min — AB (ref >60)
Glucose, Bld: 89 mg/dL (ref 65–99)
POTASSIUM: 2.9 mmol/L — AB (ref 3.5–5.1)
Sodium: 141 mmol/L (ref 135–145)
TOTAL PROTEIN: 7.2 g/dL (ref 6.5–8.1)

## 2017-12-18 LAB — CBC WITH DIFFERENTIAL/PLATELET
BASOS ABS: 0 10*3/uL (ref 0.0–0.1)
BASOS PCT: 0 %
EOS ABS: 0.2 10*3/uL (ref 0.0–0.7)
Eosinophils Relative: 4 %
HEMATOCRIT: 40.4 % (ref 36.0–46.0)
Hemoglobin: 13.2 g/dL (ref 12.0–15.0)
Lymphocytes Relative: 26 %
Lymphs Abs: 1.3 10*3/uL (ref 0.7–4.0)
MCH: 30 pg (ref 26.0–34.0)
MCHC: 32.7 g/dL (ref 30.0–36.0)
MCV: 91.8 fL (ref 78.0–100.0)
MONO ABS: 0.5 10*3/uL (ref 0.1–1.0)
Monocytes Relative: 9 %
NEUTROS ABS: 3.2 10*3/uL (ref 1.7–7.7)
NEUTROS PCT: 61 %
Platelets: 155 10*3/uL (ref 150–400)
RBC: 4.4 MIL/uL (ref 3.87–5.11)
RDW: 14.1 % (ref 11.5–15.5)
WBC: 5.2 10*3/uL (ref 4.0–10.5)

## 2017-12-18 LAB — URINALYSIS, ROUTINE W REFLEX MICROSCOPIC
Bilirubin Urine: NEGATIVE
Glucose, UA: NEGATIVE mg/dL
KETONES UR: NEGATIVE mg/dL
NITRITE: POSITIVE — AB
PROTEIN: NEGATIVE mg/dL
Specific Gravity, Urine: 1.02 (ref 1.005–1.030)
pH: 6.5 (ref 5.0–8.0)

## 2017-12-18 LAB — URINALYSIS, MICROSCOPIC (REFLEX)

## 2017-12-18 MED ORDER — CEPHALEXIN 500 MG PO CAPS: 500.0000 mg | ORAL_CAPSULE | Freq: Four times a day (QID) | ORAL | 0 refills | Status: AC

## 2017-12-18 MED ORDER — POTASSIUM CHLORIDE CRYS ER 20 MEQ PO TBCR
40.0000 meq | EXTENDED_RELEASE_TABLET | Freq: Once | ORAL | Status: AC
  Administered 2017-12-18: 40 meq via ORAL
  Filled 2017-12-18: qty 2

## 2017-12-18 MED ORDER — CEPHALEXIN 250 MG PO CAPS
500.0000 mg | ORAL_CAPSULE | Freq: Once | ORAL | Status: AC
  Administered 2017-12-18: 500 mg via ORAL
  Filled 2017-12-18: qty 2

## 2017-12-18 NOTE — ED Triage Notes (Signed)
Patient is brought in by her husband because she has had "confusion" and "combativeness" at home. He feels that she has a UTI - the patient reports that she does not feel like this is anything and he "made me come" - The patient is sitting up a/o x 4 - Husband reports that the patient is mixing up things that would be  Normal for her. She is seeing and hearing things that are not their per the husband. Husband states this is the same thing since August but it is getting worse over the last 7 - 10 days

## 2017-12-18 NOTE — ED Provider Notes (Signed)
[facility] EMERGENCY DEPARTMENT Provider Note   CSN: [id number] Arrival date & time: 12/18/17  1033     History   Chief Complaint Chief Complaint  Patient presents with  . Altered Mental Status    HPI Sarah Huffman is a 71 y.o. female.  Pt presents to the ED today with altered mental status.  The pt's husband said that she has been more confused than normal.  He said that she is acting similar to how she was in August when she was septic from an UTI.  The pt feels like she is fine and denies any sx or pain.      Past Medical History:  Diagnosis Date  . AKI (acute kidney injury) ([place]) 07/2017  . Anxiety   . Arthritis   . Cancer ([facility])    HISTORY OF BREAST CANCER  . Chronic pain   . Depressed   . Hypercholesterolemia   . Hypertension   . Pyelonephritis 08/17/2017    Patient Active Problem List   Diagnosis Date Noted  . Pyelonephritis 08/17/2017  . Acute encephalopathy 08/17/2017  . Hyponatremia 08/17/2017  . AKI (acute kidney injury) ([place]) 08/17/2017  . Hypokalemia 08/17/2017  . Essential hypertension 08/17/2017  . Depression with anxiety 08/17/2017  . Chronic pain 08/17/2017  . Right arm weakness 08/17/2017  . UTI (urinary tract infection)     Past Surgical History:  Procedure Laterality Date  . BREAST SURGERY    . TUBAL LIGATION      OB History    No data available       Home Medications    Prior to Admission medications   Medication Sig Start Date End Date Taking? Authorizing Provider  alendronate (FOSAMAX) 70 MG tablet Take 70 mg by mouth once a week. Take with a full glass of water on an empty stomach.    [provider]  aspirin EC 81 MG tablet Take 81 mg by mouth daily.    [provider]  b complex vitamins tablet Take 1 tablet by mouth daily.    [provider]  cephALEXin (KEFLEX) 500 MG capsule Take 1 capsule (500 mg total) by mouth 4 (four) times daily. 12/18/17   Isla Pence, MD  diazepam  (VALIUM) 5 MG tablet Take 5 mg by mouth 2 (two) times daily as needed for anxiety.     [provider]  DULoxetine (CYMBALTA) 30 MG capsule Take 30 mg by mouth at bedtime.  06/08/17   [provider]  gabapentin (NEURONTIN) 400 MG capsule Take 800 mg by mouth 4 (four) times daily. 07/28/17   [provider]  lisinopril (PRINIVIL,ZESTRIL) 10 MG tablet Take 5 mg by mouth daily.     [provider]  simvastatin (ZOCOR) 20 MG tablet Take 20 mg by mouth at bedtime.     [provider]  traMADol (ULTRAM) 50 MG tablet Take 50 mg by mouth 3 (three) times daily.     [provider]    Family History History reviewed. No pertinent family history.  Social History Social History   Tobacco Use  . Smoking status: Never Smoker  . Smokeless tobacco: Never Used  Substance Use Topics  . Alcohol use: No  . Drug use: No     Allergies   Amitriptyline; Codeine; Ivp dye [iodinated diagnostic agents]; and Meloxicam   Review of Systems Review of Systems  Neurological:       Confusion  All other systems reviewed and are negative.  Physical Exam Updated Vital Signs BP (!) 156/100 (BP Location: Right Arm)   Pulse 82   Temp 98.6 F (37 C) (Oral)   Resp 16   Ht 5\' 3"  (1.6 m)   Wt 68 kg (150 lb)   SpO2 98%   BMI 26.57 kg/m   Physical Exam  Constitutional: She is oriented to person, place, and time. She appears well-developed and well-nourished.  HENT:  Head: Normocephalic and atraumatic.  Right Ear: External ear normal.  Left Ear: External ear normal.  Nose: Nose normal.  Mouth/Throat: Oropharynx is clear and moist.  Eyes: Conjunctivae and EOM are normal. Pupils are equal, round, and reactive to light.  Neck: Normal range of motion. Neck supple.  Cardiovascular: Normal rate, regular rhythm, normal heart sounds and intact distal pulses.  Pulmonary/Chest: Effort normal and breath sounds normal.  Abdominal: Soft. Bowel sounds are normal.    Musculoskeletal: Normal range of motion.  Neurological: She is alert and oriented to person, place, and time.  Skin: Skin is warm and dry. Capillary refill takes less than 2 seconds.  Psychiatric: She has a normal mood and affect. Her behavior is normal. Judgment and thought content normal.  Nursing note and vitals reviewed.    ED Treatments / Results  Labs (all labs ordered are listed, but only abnormal results are displayed) Labs Reviewed  URINALYSIS, ROUTINE W REFLEX MICROSCOPIC - Abnormal; Notable for the following components:      Result Value   APPearance CLOUDY (*)    Hgb urine dipstick SMALL (*)    Nitrite POSITIVE (*)    Leukocytes, UA LARGE (*)    All other components within normal limits  URINALYSIS, MICROSCOPIC (REFLEX) - Abnormal; Notable for the following components:   Bacteria, UA MANY (*)    Squamous Epithelial / LPF 0-5 (*)    All other components within normal limits  COMPREHENSIVE METABOLIC PANEL - Abnormal; Notable for the following components:   Potassium 2.9 (*)    Creatinine, Ser 1.03 (*)    ALT 9 (*)    GFR calc non Af Amer 54 (*)    All other components within normal limits  URINE CULTURE  CBC WITH DIFFERENTIAL/PLATELET    EKG  EKG Interpretation None       Radiology No results found.  Procedures Procedures (including critical care time)  Medications Ordered in ED Medications  potassium chloride SA (K-DUR,KLOR-CON) CR tablet 40 mEq (not administered)  cephALEXin (KEFLEX) capsule 500 mg (not administered)     Initial Impression / Assessment and Plan / ED Course  I have reviewed the triage vital signs and the nursing notes.  Pertinent labs & imaging results that were available during my care of the patient were reviewed by me and considered in my medical decision making (see chart for details).     Pt looks good.  No need for admission.  She will be treated for her uti with keflex.  She is given her first dose here prior to d/c.   She is also given a dose of potassium.  She knows to return if worse.  Final Clinical Impressions(s) / ED Diagnoses   Final diagnoses:  Acute cystitis without hematuria  Hypokalemia  Confusion    ED Discharge Orders        Ordered    cephALEXin (KEFLEX) 500 MG capsule  4 times daily     12/18/17 1337       Isla Pence, MD 12/18/17 1338

## 2017-12-22 LAB — URINE CULTURE

## 2017-12-23 ENCOUNTER — Telehealth: Payer: Self-pay | Admitting: Emergency Medicine

## 2017-12-23 NOTE — Progress Notes (Signed)
ED Antimicrobial Stewardship Positive Culture Follow Up   Bradi Arbuthnot is an 71 y.o. female who presented to [facility] on 12/18/2017 with a chief complaint of  Chief Complaint  Patient presents with  . Altered Mental Status    Recent Results (from the past 720 hour(s))  Urine culture     Status: Abnormal   Collection Time: 12/18/17 10:45 AM  Result Value Ref Range Status   Specimen Description URINE, RANDOM  Final   Special Requests NONE  Final   Culture 40,000 COLONIES/mL PSEUDOMONAS AERUGINOSA (A)  Final   Report Status 12/22/2017 FINAL  Final   Organism ID, Bacteria PSEUDOMONAS AERUGINOSA (A)  Final      Susceptibility   Pseudomonas aeruginosa - MIC*    CEFTAZIDIME 2 SENSITIVE Sensitive     CIPROFLOXACIN <=0.25 SENSITIVE Sensitive     GENTAMICIN <=1 SENSITIVE Sensitive     IMIPENEM >=16 RESISTANT Resistant     PIP/TAZO 8 SENSITIVE Sensitive     CEFEPIME 2 SENSITIVE Sensitive     * 40,000 COLONIES/mL PSEUDOMONAS AERUGINOSA    [x]  Treated with Cephalexin, organism resistant to prescribed antimicrobial   New antibiotic prescription: Stop cephalexin cipro 250mg  PO BID x 5 days  ED Provider: Curlene Labrum, PA-C   Norva Riffle 12/23/2017, 8:35 AM Infectious Diseases Pharmacist Phone# [phone]

## 2017-12-23 NOTE — Telephone Encounter (Signed)
Post ED Visit - Positive Culture Follow-up: Successful Patient Follow-Up  Culture assessed and recommendations reviewed by: []  Elenor Quinones, Pharm.D. []  Heide Guile, Pharm.D., BCPS AQ-ID []  Parks [place]une, Pharm.D., BCPS []  Alycia Rossetti, Pharm.D., BCPS []  [place], Pharm.D., BCPS, AAHIVP [x]  Legrand Como, Pharm.D., BCPS, AAHIVP []  Salome Arnt, PharmD, BCPS []  Dimitri Ped, PharmD, BCPS []  Vincenza Hews, PharmD, BCPS  Positive urine culture  []  Patient discharged without antimicrobial prescription and treatment is now indicated [x]  Organism is resistant to prescribed ED discharge antimicrobial []  Patient with positive blood cultures  Changes discussed with ED provider: Krystal Clark PA New antibiotic prescription: stop cephalexin, start cipro 250mg  po bid x 5 days  Attempting to contact patient   Hazle Nordmann 12/23/2017, 12:02 PM

## 2019-04-25 ENCOUNTER — Emergency Department (HOSPITAL_BASED_OUTPATIENT_CLINIC_OR_DEPARTMENT_OTHER)
Admission: EM | Admit: 2019-04-25 | Discharge: 2019-04-25 | Disposition: A | Discharge status: Home / Self Care | Payer: Medicare HMO | Attending: Emergency Medicine | Admitting: Emergency Medicine

## 2019-04-25 ENCOUNTER — Other Ambulatory Visit: Payer: Self-pay

## 2019-04-25 ENCOUNTER — Encounter (HOSPITAL_BASED_OUTPATIENT_CLINIC_OR_DEPARTMENT_OTHER): Payer: Self-pay

## 2019-04-25 DIAGNOSIS — R410 Disorientation, unspecified: Secondary | ICD-10-CM | POA: Insufficient documentation

## 2019-04-25 DIAGNOSIS — Z79899 Other long term (current) drug therapy: Secondary | ICD-10-CM | POA: Diagnosis not present

## 2019-04-25 DIAGNOSIS — R4182 Altered mental status, unspecified: Secondary | ICD-10-CM | POA: Diagnosis present

## 2019-04-25 DIAGNOSIS — Z7982 Long term (current) use of aspirin: Secondary | ICD-10-CM | POA: Insufficient documentation

## 2019-04-25 DIAGNOSIS — I10 Essential (primary) hypertension: Secondary | ICD-10-CM | POA: Diagnosis not present

## 2019-04-25 LAB — COMPREHENSIVE METABOLIC PANEL
ALT: 19 U/L (ref 0–44)
AST: 25 U/L (ref 15–41)
Albumin: 4.3 g/dL (ref 3.5–5.0)
Alkaline Phosphatase: 73 U/L (ref 38–126)
Anion gap: 11 (ref 5–15)
BUN: 11 mg/dL (ref 8–23)
CO2: 24 mmol/L (ref 22–32)
Calcium: 9.5 mg/dL (ref 8.9–10.3)
Chloride: 97 mmol/L — ABNORMAL LOW (ref 98–111)
Creatinine, Ser: 1.5 mg/dL — ABNORMAL HIGH (ref 0.44–1.00)
GFR calc Af Amer: 40 mL/min — ABNORMAL LOW (ref >60)
GFR calc non Af Amer: 35 mL/min — ABNORMAL LOW (ref >60)
Glucose, Bld: 99 mg/dL (ref 70–99)
Potassium: 4.5 mmol/L (ref 3.5–5.1)
Sodium: 132 mmol/L — ABNORMAL LOW (ref 135–145)
Total Bilirubin: 0.3 mg/dL (ref 0.3–1.2)
Total Protein: 8.1 g/dL (ref 6.5–8.1)

## 2019-04-25 LAB — CBC WITH DIFFERENTIAL/PLATELET
Abs Immature Granulocytes: 0.02 10*3/uL (ref 0.00–0.07)
Basophils Absolute: 0 10*3/uL (ref 0.0–0.1)
Basophils Relative: 0 %
Eosinophils Absolute: 0.2 10*3/uL (ref 0.0–0.5)
Eosinophils Relative: 3 %
HCT: 44.1 % (ref 36.0–46.0)
Hemoglobin: 14 g/dL (ref 12.0–15.0)
Immature Granulocytes: 0 %
Lymphocytes Relative: 23 %
Lymphs Abs: 1.7 10*3/uL (ref 0.7–4.0)
MCH: 30.2 pg (ref 26.0–34.0)
MCHC: 31.7 g/dL (ref 30.0–36.0)
MCV: 95.2 fL (ref 80.0–100.0)
Monocytes Absolute: 0.7 10*3/uL (ref 0.1–1.0)
Monocytes Relative: 9 %
Neutro Abs: 4.7 10*3/uL (ref 1.7–7.7)
Neutrophils Relative %: 65 %
Platelets: 203 10*3/uL (ref 150–400)
RBC: 4.63 MIL/uL (ref 3.87–5.11)
RDW: 13.5 % (ref 11.5–15.5)
WBC: 7.3 10*3/uL (ref 4.0–10.5)
nRBC: 0 % (ref 0.0–0.2)

## 2019-04-25 LAB — URINALYSIS, ROUTINE W REFLEX MICROSCOPIC
Bilirubin Urine: NEGATIVE
Glucose, UA: NEGATIVE mg/dL
Hgb urine dipstick: NEGATIVE
Ketones, ur: NEGATIVE mg/dL
Leukocytes,Ua: NEGATIVE
Nitrite: NEGATIVE
Protein, ur: NEGATIVE mg/dL
Specific Gravity, Urine: 1.015 (ref 1.005–1.030)
pH: 7 (ref 5.0–8.0)

## 2019-04-25 NOTE — ED Triage Notes (Signed)
Pt was dx with UTI 10 days ago by PCP and started on abx. Pt had a urine culture completed and then was changed to a different abx. Pt has completed the course of abx, but pt is still having confusion per husband. Per husband pt was hallucinating that she had a phone in her hand earlier today.

## 2019-04-25 NOTE — ED Provider Notes (Signed)
[facility] EMERGENCY DEPARTMENT Provider Note   CSN: [id number] Arrival date & time: 04/25/19  1452    History   Chief Complaint Chief Complaint  Patient presents with  . Altered Mental Status    HPI Sarah Huffman is a 73 y.o. female.     HPI   73yo female with history of hypertension, hypercholesterolemia, breast cancer, chronic pain who presents with concern for confusion.  Culture taken of urine 4/21 positive for greater than 100000 citrobacter amalonaticus resistant to ampicillin/intermediant augmentin and her amoxicillin was switched to bactrim. She has now completed the abx but is still showing confusion.  Husband reports these symptoms are consistent with when she has had UTI in the past.  She and he both deny any other symptoms. No falls, no cough, no CP/n/v/d/dyspnea/fever/body aches/congestion. No medicine changes. No sick contacts. No numbness/weakness/change in vision/falls/gait changes/difficulty talking/facial droop.   Patient reports she feels fine.  Does admit she had an episode earlier when she was trying to "do something on my phone" and then husband noted her phone was on the table. Reports she was confused, thought she was using her phone but was just looking at her hand.   She reports she is not confused but admits to this one episode. Husband reports more episodes of confusion and memory problems.   Past Medical History:  Diagnosis Date  . AKI (acute kidney injury) ([place]) 07/2017  . Anxiety   . Arthritis   . Cancer ([facility])    HISTORY OF BREAST CANCER  . Chronic pain   . Depressed   . Hypercholesterolemia   . Hypertension   . Pyelonephritis 08/17/2017    Patient Active Problem List   Diagnosis Date Noted  . Pyelonephritis 08/17/2017  . Acute encephalopathy 08/17/2017  . Hyponatremia 08/17/2017  . AKI (acute kidney injury) ([place]) 08/17/2017  . Hypokalemia 08/17/2017  . Essential hypertension 08/17/2017  . Depression with anxiety 08/17/2017   . Chronic pain 08/17/2017  . Right arm weakness 08/17/2017  . UTI (urinary tract infection)     Past Surgical History:  Procedure Laterality Date  . BREAST SURGERY    . TUBAL LIGATION       OB History   No obstetric history on file.      Home Medications    Prior to Admission medications   Medication Sig Start Date End Date Taking? Authorizing Provider  sulfamethoxazole-trimethoprim (BACTRIM) 200-40 MG/5ML suspension Take by mouth 2 (two) times daily.   Yes [provider]  alendronate (FOSAMAX) 70 MG tablet Take 70 mg by mouth once a week. Take with a full glass of water on an empty stomach.    [provider]  aspirin EC 81 MG tablet Take 81 mg by mouth daily.    [provider]  b complex vitamins tablet Take 1 tablet by mouth daily.    [provider]  cephALEXin (KEFLEX) 500 MG capsule Take 1 capsule (500 mg total) by mouth 4 (four) times daily. 12/18/17   Isla Pence, MD  diazepam (VALIUM) 5 MG tablet Take 5 mg by mouth 2 (two) times daily as needed for anxiety.     [provider]  DULoxetine (CYMBALTA) 30 MG capsule Take 30 mg by mouth at bedtime.  06/08/17   [provider]  gabapentin (NEURONTIN) 400 MG capsule Take 800 mg by mouth 4 (four) times daily. 07/28/17   [provider]  lisinopril (PRINIVIL,ZESTRIL) 10 MG tablet Take 5 mg by mouth daily.  [provider]  simvastatin (ZOCOR) 20 MG tablet Take 20 mg by mouth at bedtime.     [provider]  traMADol (ULTRAM) 50 MG tablet Take 50 mg by mouth 3 (three) times daily.     [provider]    Family History No family history on file.  Social History Social History   Tobacco Use  . Smoking status: Never Smoker  . Smokeless tobacco: Never Used  Substance Use Topics  . Alcohol use: No  . Drug use: No     Allergies   Amitriptyline; Codeine; Ivp dye [iodinated diagnostic agents]; and Meloxicam   Review of Systems  Review of Systems  Constitutional: Positive for fatigue (chronic). Negative for appetite change and fever.  HENT: Negative for sore throat.   Eyes: Negative for visual disturbance.  Respiratory: Negative for cough and shortness of breath.   Cardiovascular: Negative for chest pain.  Gastrointestinal: Positive for constipation. Negative for abdominal pain, diarrhea, nausea and vomiting.  Genitourinary: Negative for decreased urine volume, difficulty urinating and flank pain.  Musculoskeletal: Negative for back pain and neck pain.  Skin: Negative for rash.  Neurological: Negative for syncope and headaches.  Psychiatric/Behavioral: Positive for confusion and hallucinations.     Physical Exam Updated Vital Signs BP (!) 150/91   Pulse 71   Temp 98.7 F (37.1 C) (Oral)   Resp 19   Wt 62.1 kg   SpO2 98%   BMI 24.27 kg/m   Physical Exam Vitals signs and nursing note reviewed.  Constitutional:      General: She is not in acute distress.    Appearance: She is well-developed. She is not diaphoretic.  HENT:     Head: Normocephalic and atraumatic.  Eyes:     General: No visual field deficit.    Conjunctiva/sclera: Conjunctivae normal.  Neck:     Musculoskeletal: Normal range of motion.  Cardiovascular:     Rate and Rhythm: Normal rate and regular rhythm.     Heart sounds: Normal heart sounds. No murmur. No friction rub. No gallop.   Pulmonary:     Effort: Pulmonary effort is normal. No respiratory distress.     Breath sounds: Normal breath sounds. No wheezing or rales.  Abdominal:     General: There is no distension.     Palpations: Abdomen is soft.     Tenderness: There is no abdominal tenderness. There is no guarding.  Musculoskeletal:        General: No tenderness.  Skin:    General: Skin is warm and dry.     Findings: No erythema or rash.  Neurological:     Mental Status: She is alert and oriented to person, place, and time.     GCS: GCS eye subscore is 4. GCS verbal  subscore is 5. GCS motor subscore is 6.     Cranial Nerves: No cranial nerve deficit, dysarthria or facial asymmetry.     Sensory: No sensory deficit.     Motor: Motor function is intact. No tremor or pronator drift.     Coordination: Coordination normal. Finger-Nose-Finger Test normal.     Gait: Gait is intact.     Comments: Stated the wrong year twice before self-correcting and stating 2020      ED Treatments / Results  Labs (all labs ordered are listed, but only abnormal results are displayed) Labs Reviewed  COMPREHENSIVE METABOLIC PANEL - Abnormal; Notable for the following components:      Result Value   Sodium  132 (*)    Chloride 97 (*)    Creatinine, Ser 1.50 (*)    GFR calc non Af Amer 35 (*)    GFR calc Af Amer 40 (*)    All other components within normal limits  URINE CULTURE  CBC WITH DIFFERENTIAL/PLATELET  URINALYSIS, ROUTINE W REFLEX MICROSCOPIC    EKG EKG Interpretation  Date/Time:  Wednesday April 25 2019 16:04:08 EDT Ventricular Rate:  75 PR Interval:    QRS Duration: 97 QT Interval:  404 QTC Calculation: 452 R Axis:   -66 Text Interpretation:  Normal sinus rhythm Artifact Left anterior fascicular block Abnormal R-wave progression, late transition Baseline wander in lead(s) V1 No significant change since last tracing Confirmed by Gareth Morgan [phone]) on 04/25/2019 6:09:33 PM   Radiology No results found.  Procedures Procedures (including critical care time)  Medications Ordered in ED Medications - No data to display   Initial Impression / Assessment and Plan / ED Course  I have reviewed the triage vital signs and the nursing notes.  Pertinent labs & imaging results that were available during my care of the patient were reviewed by me and considered in my medical decision making (see chart for details).        73yo female with history of hypertension, hypercholesterolemia, breast cancer, chronic pain who presents with concern for  confusion.  Culture taken of urine 4/21 positive for greater than 100000 citrobacter amalonaticus resistant to ampicillin/intermediant augmentin and her amoxicillin was switched to bactrim.  Neurologic exam normal, no signs of CVA. No CP/dyspnea/cough doubt ACS/PE/pneumonia.  Denies medication changes.  CBC without leukocytosis and no anemia.  CMP shows electrolytes with mild hyponatremia, however not significant enough to cause symptoms. Mild elevation in Cr, suspect possible dehydration.    Repeat urinalysis today shows no sign of infection. Will send for culture.  She is well appearing with normal vital signs, no hx of head trauma, no significant headache, normal neuro exam and low suspicion for acute CVA or intracranial bleed.  Discussed with husband and patient that we could order CT head, however given above, feel continued outpatient evaluation is appropriate (I.e. MRI brain, thyroid/b12, cognitive testing) and potential neuro-cognitive evaluation.  Patient discharged in stable condition with understanding of reasons to return.    Final Clinical Impressions(s) / ED Diagnoses   Final diagnoses:  Confusion    ED Discharge Orders    None       Gareth Morgan, MD 04/25/19 [phone]

## 2019-04-25 NOTE — ED Notes (Signed)
Pt A/O-states she is here because she is confused and has a UTI-pt taken to tx room-steady gait-husband in car due to covid 17 visitor restrictions and to be contacted by Rosemarie Ax, charge RN for more details

## 2019-04-27 LAB — URINE CULTURE: Culture: NO GROWTH

## 2021-08-12 ENCOUNTER — Other Ambulatory Visit: Payer: Self-pay

## 2021-08-12 ENCOUNTER — Emergency Department (HOSPITAL_BASED_OUTPATIENT_CLINIC_OR_DEPARTMENT_OTHER): Payer: Medicare HMO

## 2021-08-12 ENCOUNTER — Encounter (HOSPITAL_BASED_OUTPATIENT_CLINIC_OR_DEPARTMENT_OTHER): Payer: Self-pay

## 2021-08-12 ENCOUNTER — Emergency Department (HOSPITAL_BASED_OUTPATIENT_CLINIC_OR_DEPARTMENT_OTHER)
Admission: EM | Admit: 2021-08-12 | Discharge: 2021-08-12 | Disposition: A | Discharge status: Home / Self Care | Payer: Medicare HMO | Attending: Emergency Medicine | Admitting: Emergency Medicine

## 2021-08-12 DIAGNOSIS — W1839XA Other fall on same level, initial encounter: Secondary | ICD-10-CM | POA: Diagnosis not present

## 2021-08-12 DIAGNOSIS — I10 Essential (primary) hypertension: Secondary | ICD-10-CM | POA: Diagnosis not present

## 2021-08-12 DIAGNOSIS — Z79899 Other long term (current) drug therapy: Secondary | ICD-10-CM | POA: Insufficient documentation

## 2021-08-12 DIAGNOSIS — R079 Chest pain, unspecified: Secondary | ICD-10-CM | POA: Diagnosis present

## 2021-08-12 DIAGNOSIS — R0789 Other chest pain: Secondary | ICD-10-CM | POA: Diagnosis not present

## 2021-08-12 DIAGNOSIS — W19XXXA Unspecified fall, initial encounter: Secondary | ICD-10-CM

## 2021-08-12 DIAGNOSIS — Z7982 Long term (current) use of aspirin: Secondary | ICD-10-CM | POA: Insufficient documentation

## 2021-08-12 NOTE — ED Triage Notes (Signed)
Pt and husband states she fell ~2 weeks ago-pain to right rib area-NAD-steady gait

## 2021-08-12 NOTE — Discharge Instructions (Addendum)
Your history, exam, and imaging today are consistent with a musculoskeletal chest wall pain from her fall in the context of your chronic right torso pain.  The x-ray did not show any evidence of acute fractures or collapsed lung.  As we discussed, there is still a very small chance of occult or hidden fractures however I have a low suspicion for this.  Please take the pain medicine she was prescribed by her pain team more regularly and call the pain specialist to discuss further medication changes.  If any symptoms change or worsen acutely, please return to the nearest emergency department.

## 2021-08-12 NOTE — ED Provider Notes (Signed)
[facility] EMERGENCY DEPARTMENT Provider Note   CSN: [id number] Arrival date & time: 08/12/21  1515     History Chief Complaint  Patient presents with   Sarah Huffman is a 75 y.o. female.  The history is provided by the patient and medical records. No language interpreter was used.  Fall This is a new problem. The current episode started more than 1 week ago (2 weeks ago). The problem has not changed since onset.Associated symptoms include chest pain. Pertinent negatives include no abdominal pain, no headaches and no shortness of breath. The symptoms are aggravated by coughing, twisting and bending. Nothing relieves the symptoms. She has tried nothing for the symptoms. The treatment provided no relief.      Past Medical History:  Diagnosis Date   AKI (acute kidney injury) ([place]) 07/2017   Anxiety    Arthritis    Cancer ([place])    HISTORY OF BREAST CANCER   Chronic pain    Depressed    Hypercholesterolemia    Hypertension    Pyelonephritis 08/17/2017    Patient Active Problem List   Diagnosis Date Noted   Pyelonephritis 08/17/2017   Acute encephalopathy 08/17/2017   Hyponatremia 08/17/2017   AKI (acute kidney injury) ([place]) 08/17/2017   Hypokalemia 08/17/2017   Essential hypertension 08/17/2017   Depression with anxiety 08/17/2017   Chronic pain 08/17/2017   Right arm weakness 08/17/2017   UTI (urinary tract infection)     Past Surgical History:  Procedure Laterality Date   BREAST SURGERY     TUBAL LIGATION       OB History   No obstetric history on file.     No family history on file.  Social History   Tobacco Use   Smoking status: Never   Smokeless tobacco: Never  Vaping Use   Vaping Use: Never used  Substance Use Topics   Alcohol use: No   Drug use: No    Home Medications Prior to Admission medications   Medication Sig Start Date End Date Taking? Authorizing Provider  alendronate (FOSAMAX) 70 MG tablet Take 70 mg by mouth  once a week. Take with a full glass of water on an empty stomach.    [provider]  aspirin EC 81 MG tablet Take 81 mg by mouth daily.    [provider]  b complex vitamins tablet Take 1 tablet by mouth daily.    [provider]  cephALEXin (KEFLEX) 500 MG capsule Take 1 capsule (500 mg total) by mouth 4 (four) times daily. 12/18/17   Isla Pence, MD  diazepam (VALIUM) 5 MG tablet Take 5 mg by mouth 2 (two) times daily as needed for anxiety.     [provider]  DULoxetine (CYMBALTA) 30 MG capsule Take 30 mg by mouth at bedtime.  06/08/17   [provider]  gabapentin (NEURONTIN) 400 MG capsule Take 800 mg by mouth 4 (four) times daily. 07/28/17   [provider]  lisinopril (PRINIVIL,ZESTRIL) 10 MG tablet Take 5 mg by mouth daily.     [provider]  simvastatin (ZOCOR) 20 MG tablet Take 20 mg by mouth at bedtime.     [provider]  sulfamethoxazole-trimethoprim (BACTRIM) 200-40 MG/5ML suspension Take by mouth 2 (two) times daily.    [provider]  traMADol (ULTRAM) 50 MG tablet Take 50 mg by mouth 3 (three) times daily.     [provider]    Allergies  Amitriptyline, Codeine, Ivp dye [iodinated diagnostic agents], and Meloxicam  Review of Systems   Review of Systems  Constitutional:  Negative for chills, fatigue and fever.  HENT:  Negative for congestion.   Respiratory:  Positive for cough. Negative for chest tightness, shortness of breath and wheezing.   Cardiovascular:  Positive for chest pain. Negative for palpitations and leg swelling.  Gastrointestinal:  Negative for abdominal pain, constipation, diarrhea, nausea and vomiting.  Genitourinary:  Negative for dysuria.  Musculoskeletal:  Negative for back pain, neck pain and neck stiffness.  Skin:  Negative for rash and wound.  Neurological:  Negative for light-headedness and headaches.  Psychiatric/Behavioral:  Negative for agitation.    All other systems reviewed and are negative.  Physical Exam Updated Vital Signs BP (!) 183/109 (BP Location: Left Arm)   Pulse 88   Temp 98.3 F (36.8 C) (Oral)   Resp 20   Ht '5\' 4"'$  (1.626 m)   Wt 62.1 kg   SpO2 99%   BMI 23.52 kg/m   Physical Exam Vitals and nursing note reviewed.  Constitutional:      General: She is not in acute distress.    Appearance: She is well-developed. She is not ill-appearing, toxic-appearing or diaphoretic.  HENT:     Head: Normocephalic and atraumatic.  Eyes:     Conjunctiva/sclera: Conjunctivae normal.     Pupils: Pupils are equal, round, and reactive to light.  Cardiovascular:     Rate and Rhythm: Normal rate and regular rhythm.     Heart sounds: No murmur heard. Pulmonary:     Effort: Pulmonary effort is normal. No respiratory distress.     Breath sounds: Normal breath sounds. No wheezing, rhonchi or rales.  Chest:     Chest wall: Tenderness present.    Abdominal:     General: Abdomen is flat.     Palpations: Abdomen is soft.     Tenderness: There is no abdominal tenderness. There is no guarding or rebound.  Musculoskeletal:        General: Tenderness present.     Cervical back: Neck supple.     Left lower leg: No edema.  Skin:    General: Skin is warm and dry.     Capillary Refill: Capillary refill takes less than 2 seconds.     Findings: No erythema.  Neurological:     Mental Status: She is alert. Mental status is at baseline.  Psychiatric:        Mood and Affect: Mood normal.    ED Results / Procedures / Treatments   Labs (all labs ordered are listed, but only abnormal results are displayed) Labs Reviewed - No data to display  EKG None  Radiology No results found.  Procedures Procedures   Medications Ordered in ED Medications - No data to display  ED Course  I have reviewed the triage vital signs and the nursing notes.  Pertinent labs & imaging results that were available during my care of the patient were  reviewed by me and considered in my medical decision making (see chart for details).    MDM Rules/Calculators/A&P                           Zakaria Pliska is a 75 y.o. female with a past medical history significant for hypertension, depression, breast cancer status post mastectomies and chronic right chest wall pain with pain management following who presents with right-sided chest wall pain after a  fall.  According to husband who provides more the history, patient had a mechanical fall falling to some chairs approximately 2 weeks ago.  For the last 2 weeks she has had worsening pain in her right chest wall and over the last few days it has worsened.  She is denying any fevers or chills but does report some cough that is nonproductive.  She denies nausea, vomiting.  She denies hitting her head and denies any loss of consciousness, neck pain, or extremity pains.  She is not having the abdominal discomfort.  She denies any rashes to suggest shingles and to her knowledge has not had any history of rib fractures.  She is holding her right chest wall during history gathering.  On exam, patient's right chest wall is tender to palpation.  There was not any bruising or laceration or rash visualized.  Her back was nontender.  Abdomen was completely nontender.  Normal bowel sounds.  Lungs were clear and breath sounds were symmetric.  Neck was nontender.  Patient otherwise at her mental status baseline per family.  Had a shared decision made conversation with husband about management and we agreed to start with x-rays of the chest with a right-sided rib focus.  We agreed to have a shared decision-making conversation if there is determination of multiple rib fractures about the possibility needing CT imaging to further evaluate versus if it is reassuring, be able to follow-up with her pain management team for exacerbation of chronic pain in the setting of superficial injury from trauma.  Anticipate reassessment  after work-up.  6:10 PM X-ray revealed no convincing evidence of fracture.  Had a shared decision-making conversation with the patient and family about management.  Due to her following a pain clinic and pain specialist, they feel comfortable taking her  prescribed medications more regularly and contacting the pain specialist to discuss escalation of the setting of this new trauma.  I overall suspect that she has soft tissue and musculoskeletal pain that exacerbate her chronic nerve pains in this location.  We did discuss the possibility of occult injury and even discussed getting CT imaging today however given her lack of tachycardia, tachypnea, or hypoxia or difficulty breathing, we agreed to hold at this time.  We understand extremely strict return precautions for any new or worsened symptoms.  Patient and family had other questions or concerns and patient discharged in good condition.  Final Clinical Impression(s) / ED Diagnoses Final diagnoses:  Fall, initial encounter  Right-sided chest wall pain    Rx / DC Orders ED Discharge Orders     None       Clinical Impression: 1. Fall, initial encounter   2. Right-sided chest wall pain     Disposition: Discharge  Condition: Good  I have discussed the results, Dx and Tx plan with the pt(& family if present). He/she/they expressed understanding and agree(s) with the plan. Discharge instructions discussed at great length. Strict return precautions discussed and pt &/or family have verbalized understanding of the instructions. No further questions at time of discharge.    New Prescriptions   No medications on file    Follow Up: Your PCP and your pain team        Hadeel Hillebrand, Gwenyth Allegra, MD 08/12/21 [phone]

## 2022-07-19 IMAGING — DX DG RIBS W/ CHEST 3+V*R*
3 series · 3 of 3 positions shown · non-contrast
Comparison: 08/17/2017

CLINICAL DATA: Fall 2 weeks ago with persistent right-sided chest
pain, initial encounter

EXAM:
RIGHT RIBS AND CHEST - 3+ VIEW

[chest pa]
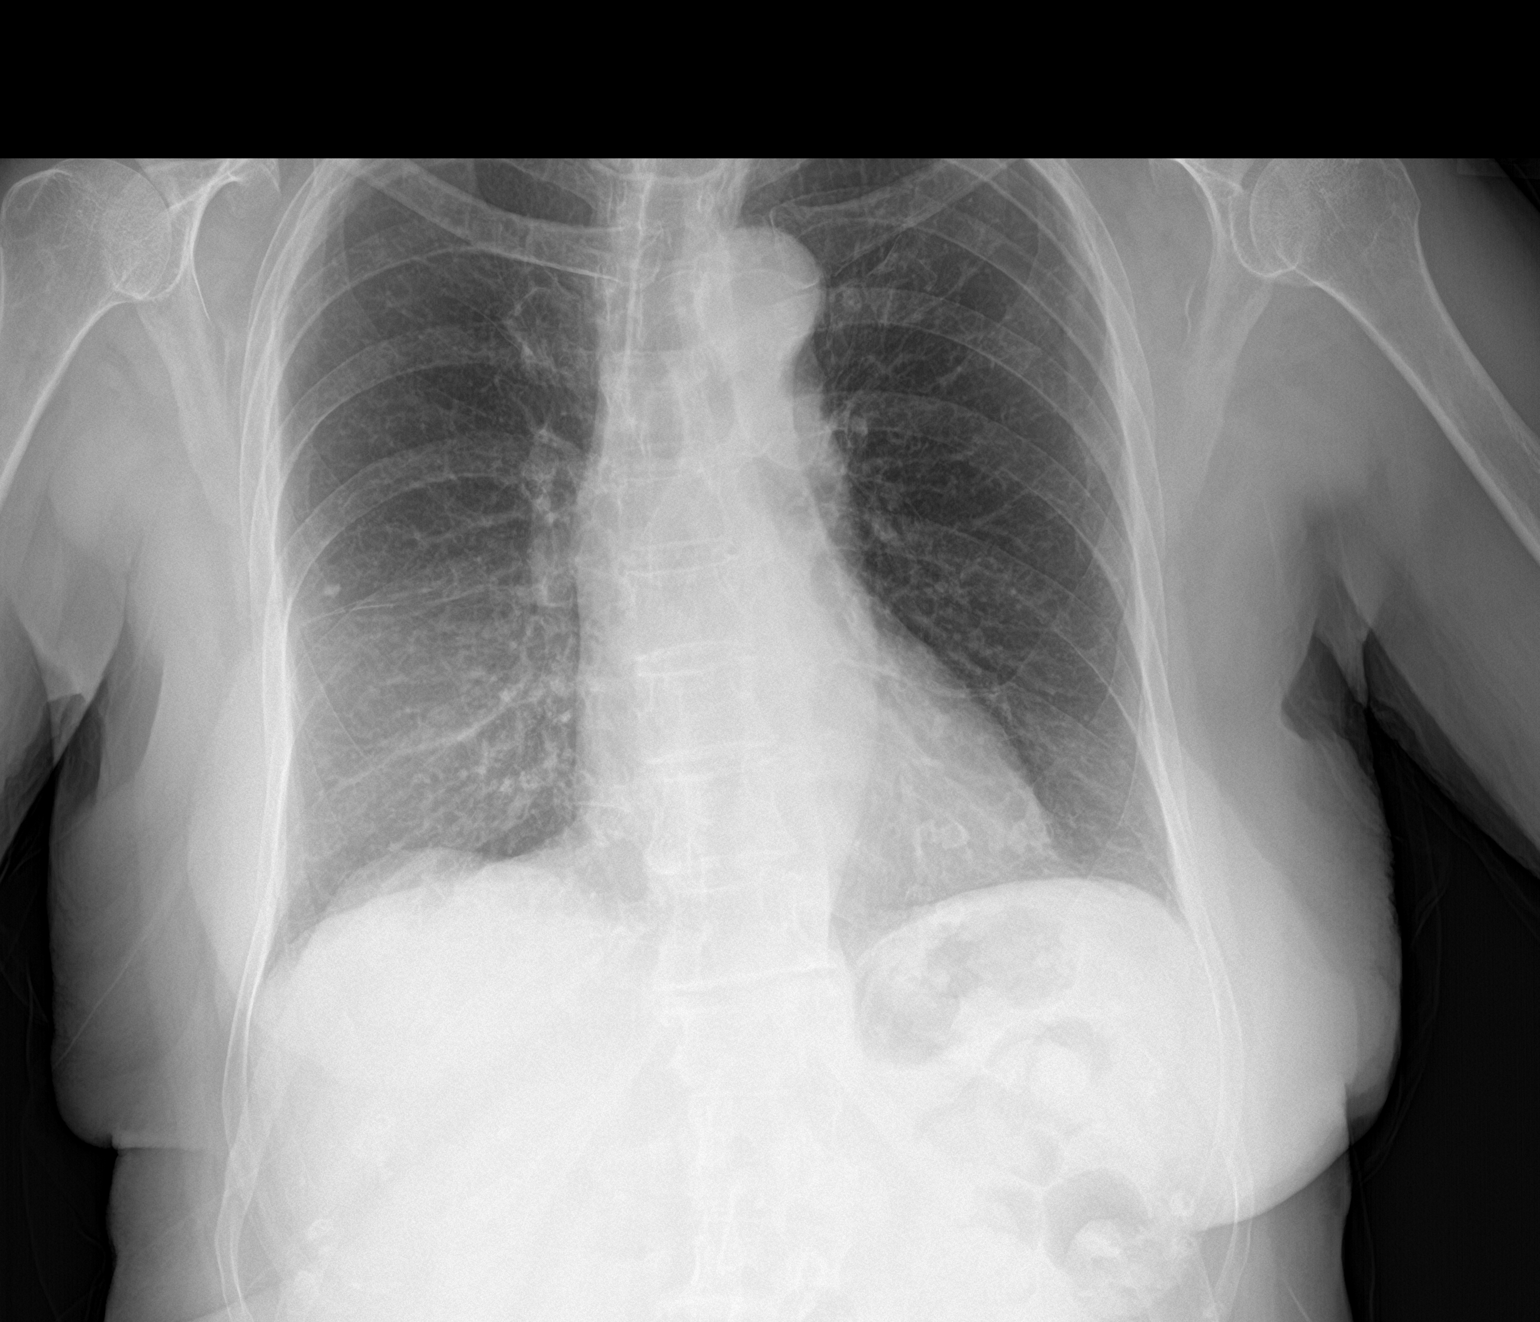

[rib pa]
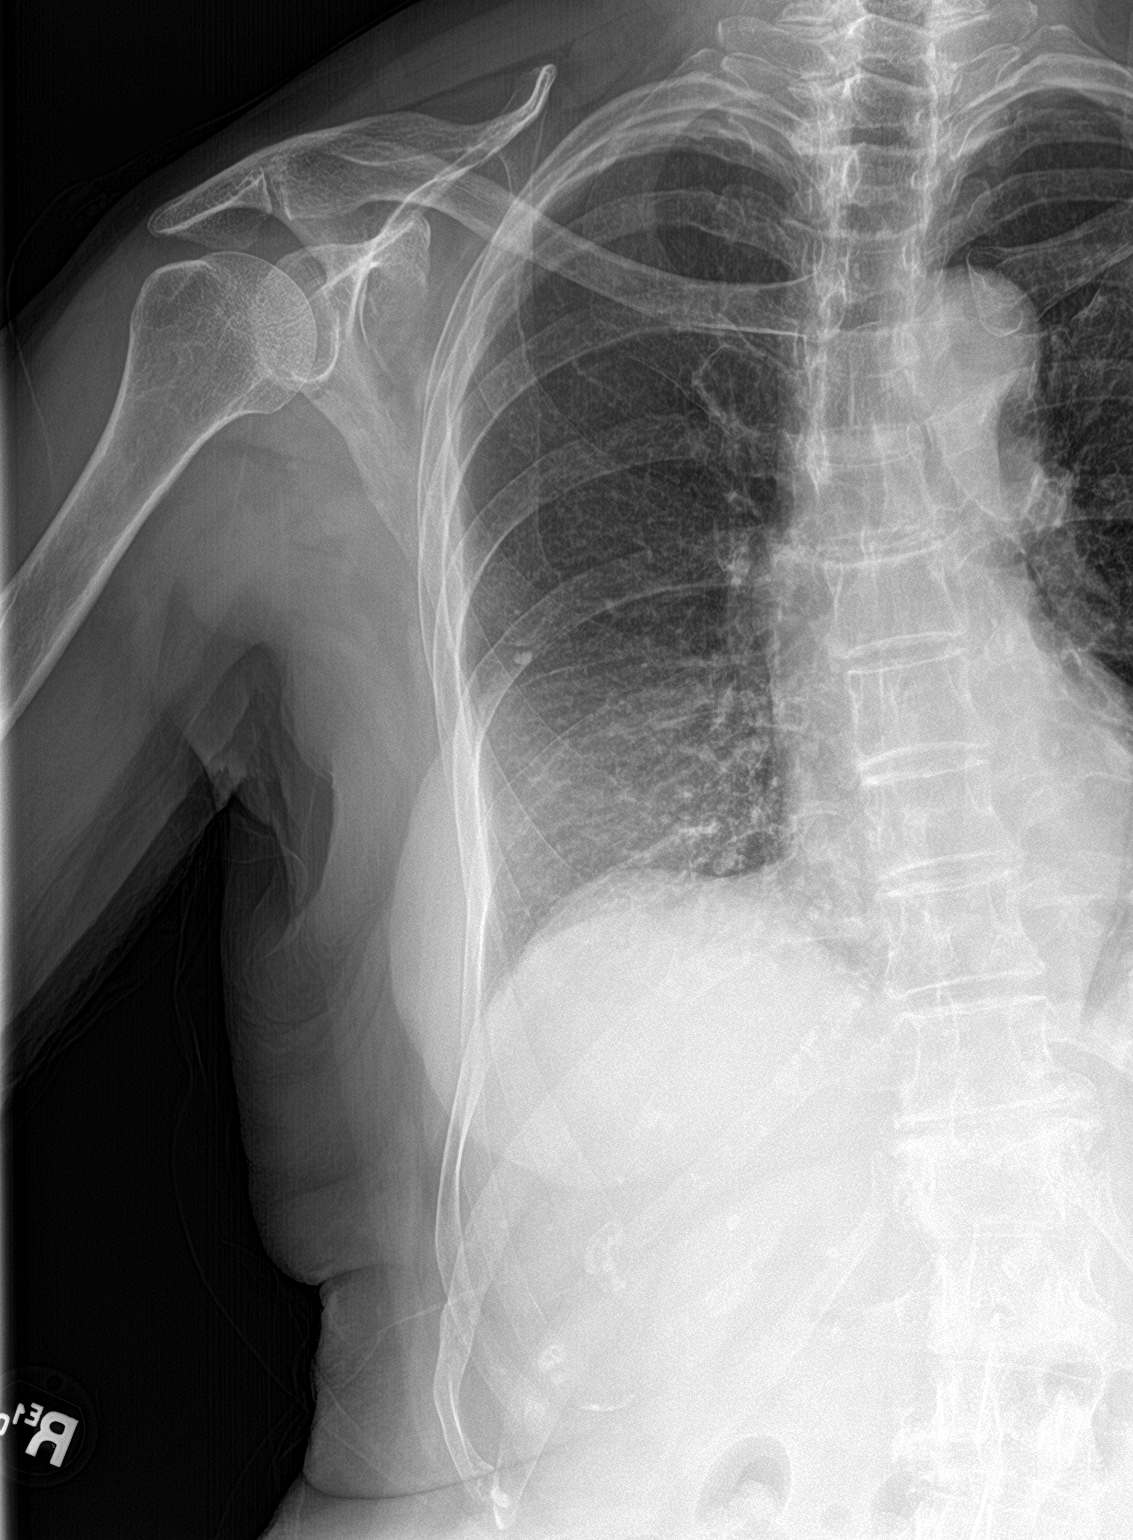

[rib pa obl]
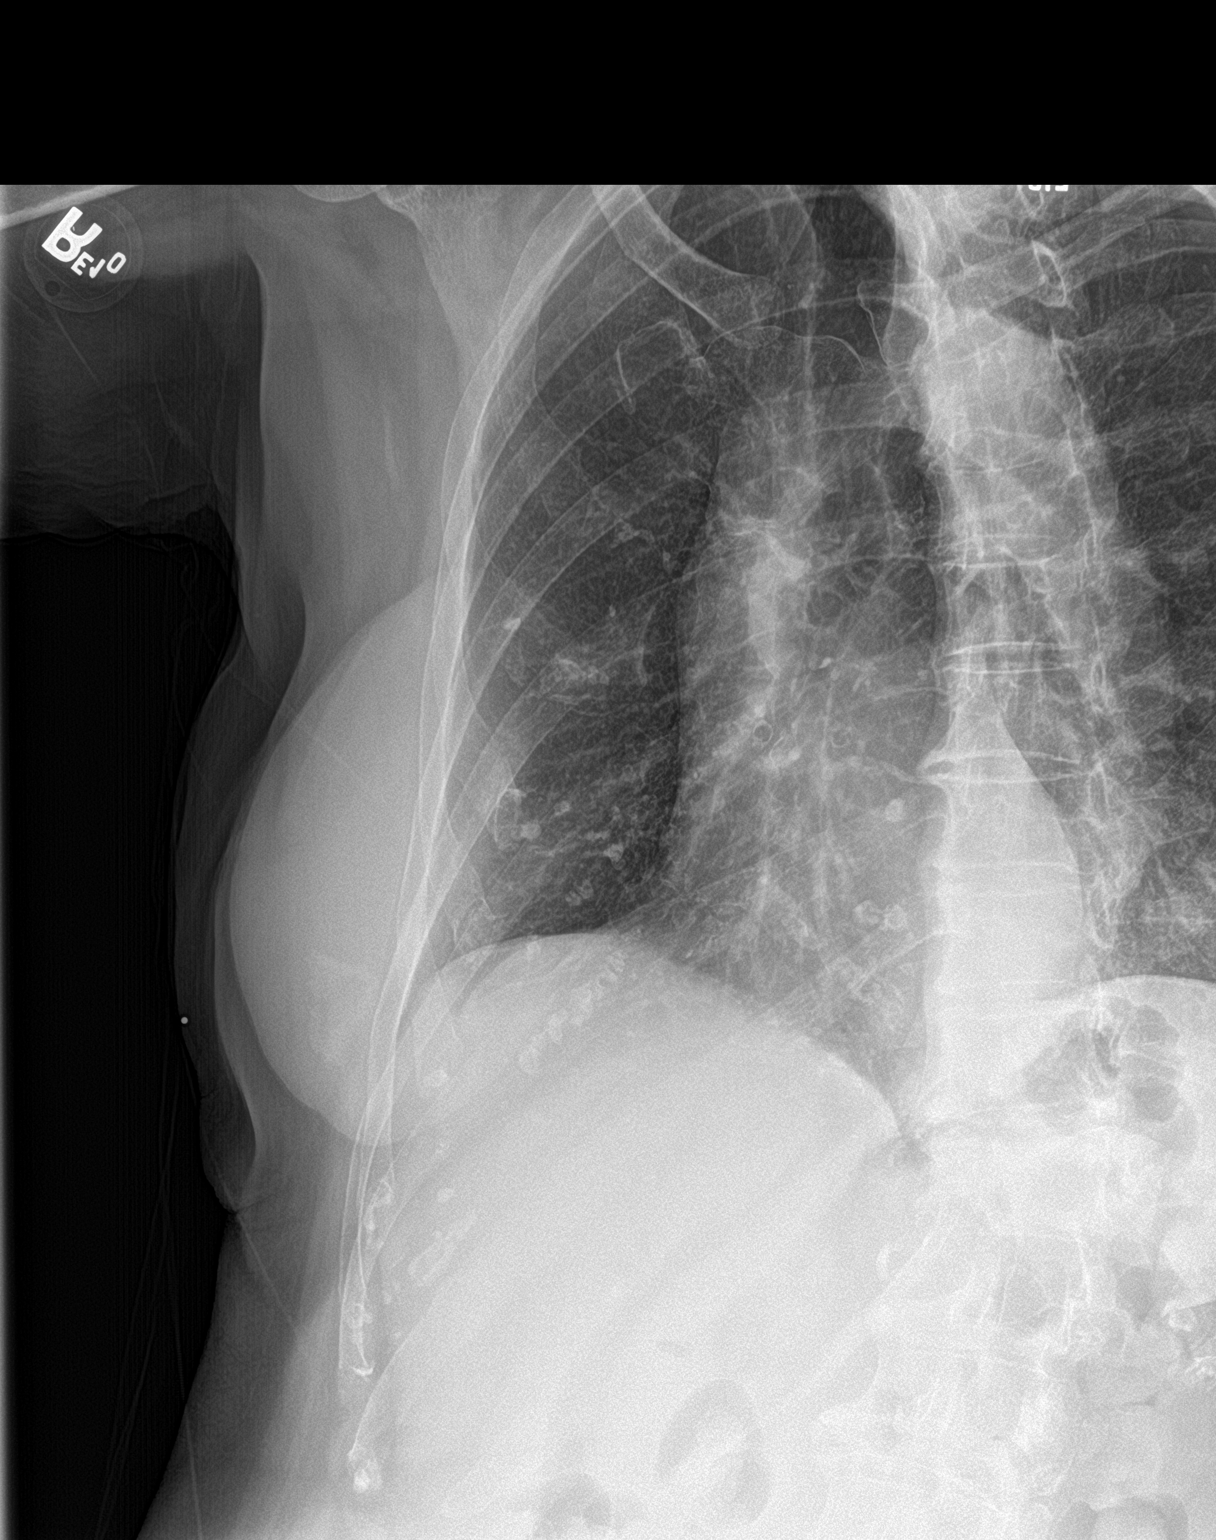

[3 of 3 positions shown; findings below may reference images not displayed]

FINDINGS: Cardiac shadow is stable. Mild aortic calcifications are seen.
Calcified granuloma is again noted in the right mid lung. Lungs are
well aerated without focal infiltrate or sizable effusion. No acute
displaced or deforming rib fracture is noted. No underlying
pneumothorax is seen.
IMPRESSION: No acute rib fracture noted.

## 2024-12-27 DEATH — deceased
